# Patient Record
Sex: Female | Born: 1945 | Race: White | Hispanic: No | Marital: Married | Smoking: Never smoker
Health system: Southern US, Community
[De-identification: ages and names within clinical notes are randomized; demographics above are authoritative.]

## PROBLEM LIST (undated history)

## (undated) DIAGNOSIS — F419 Anxiety disorder, unspecified: Secondary | ICD-10-CM

## (undated) DIAGNOSIS — K219 Gastro-esophageal reflux disease without esophagitis: Secondary | ICD-10-CM

## (undated) DIAGNOSIS — F329 Major depressive disorder, single episode, unspecified: Secondary | ICD-10-CM

## (undated) DIAGNOSIS — F32A Depression, unspecified: Secondary | ICD-10-CM

## (undated) DIAGNOSIS — M1990 Unspecified osteoarthritis, unspecified site: Secondary | ICD-10-CM

## (undated) DIAGNOSIS — R002 Palpitations: Secondary | ICD-10-CM

## (undated) DIAGNOSIS — C801 Malignant (primary) neoplasm, unspecified: Secondary | ICD-10-CM

## (undated) HISTORY — PX: BREAST SURGERY: SHX581

## (undated) HISTORY — PX: CHOLECYSTECTOMY: SHX55

## (undated) HISTORY — PX: OSTOMY TAKE DOWN: SHX2142

## (undated) HISTORY — PX: APPENDECTOMY: SHX54

## (undated) HISTORY — PX: COLON SURGERY: SHX602

---

## 1998-08-11 ENCOUNTER — Other Ambulatory Visit: Admission: RE | Admit: 1998-08-11 | Discharge: 1998-08-11 | Payer: Self-pay | Admitting: Obstetrics and Gynecology

## 1999-12-27 ENCOUNTER — Other Ambulatory Visit: Admission: RE | Admit: 1999-12-27 | Discharge: 1999-12-27 | Payer: Self-pay | Admitting: Obstetrics and Gynecology

## 2001-06-19 ENCOUNTER — Other Ambulatory Visit: Admission: RE | Admit: 2001-06-19 | Discharge: 2001-06-19 | Payer: Self-pay | Admitting: Obstetrics and Gynecology

## 2002-07-30 ENCOUNTER — Other Ambulatory Visit: Admission: RE | Admit: 2002-07-30 | Discharge: 2002-07-30 | Payer: Self-pay | Admitting: Obstetrics and Gynecology

## 2003-08-20 ENCOUNTER — Other Ambulatory Visit: Admission: RE | Admit: 2003-08-20 | Discharge: 2003-08-20 | Payer: Self-pay | Admitting: Obstetrics and Gynecology

## 2004-04-04 ENCOUNTER — Encounter: Admission: RE | Admit: 2004-04-04 | Discharge: 2004-04-04 | Payer: Self-pay | Admitting: Obstetrics and Gynecology

## 2004-06-04 ENCOUNTER — Encounter: Payer: Self-pay | Admitting: Family Medicine

## 2004-06-04 LAB — CONVERTED CEMR LAB

## 2004-07-20 ENCOUNTER — Ambulatory Visit: Payer: Self-pay | Admitting: Family Medicine

## 2004-08-26 ENCOUNTER — Ambulatory Visit (HOSPITAL_COMMUNITY): Admission: RE | Admit: 2004-08-26 | Discharge: 2004-08-26 | Payer: Self-pay | Admitting: Surgery

## 2004-08-26 ENCOUNTER — Encounter (INDEPENDENT_AMBULATORY_CARE_PROVIDER_SITE_OTHER): Payer: Self-pay | Admitting: Specialist

## 2004-08-26 ENCOUNTER — Ambulatory Visit (HOSPITAL_BASED_OUTPATIENT_CLINIC_OR_DEPARTMENT_OTHER): Admission: RE | Admit: 2004-08-26 | Discharge: 2004-08-26 | Payer: Self-pay | Admitting: Surgery

## 2004-09-01 ENCOUNTER — Other Ambulatory Visit: Admission: RE | Admit: 2004-09-01 | Discharge: 2004-09-01 | Payer: Self-pay | Admitting: Obstetrics and Gynecology

## 2005-02-28 ENCOUNTER — Ambulatory Visit: Payer: Self-pay | Admitting: Family Medicine

## 2005-09-13 ENCOUNTER — Ambulatory Visit: Payer: Self-pay | Admitting: Family Medicine

## 2005-09-27 ENCOUNTER — Other Ambulatory Visit: Admission: RE | Admit: 2005-09-27 | Discharge: 2005-09-27 | Payer: Self-pay | Admitting: Obstetrics and Gynecology

## 2005-12-20 ENCOUNTER — Encounter: Admission: RE | Admit: 2005-12-20 | Discharge: 2005-12-20 | Payer: Self-pay | Admitting: Gastroenterology

## 2005-12-28 ENCOUNTER — Encounter (INDEPENDENT_AMBULATORY_CARE_PROVIDER_SITE_OTHER): Payer: Self-pay | Admitting: *Deleted

## 2005-12-28 ENCOUNTER — Ambulatory Visit: Payer: Self-pay | Admitting: Pulmonary Disease

## 2005-12-28 ENCOUNTER — Inpatient Hospital Stay (HOSPITAL_COMMUNITY): Admission: RE | Admit: 2005-12-28 | Discharge: 2006-01-29 | Payer: Self-pay | Admitting: Surgery

## 2006-01-08 ENCOUNTER — Encounter (INDEPENDENT_AMBULATORY_CARE_PROVIDER_SITE_OTHER): Payer: Self-pay | Admitting: *Deleted

## 2006-02-03 ENCOUNTER — Emergency Department (HOSPITAL_COMMUNITY): Admission: EM | Admit: 2006-02-03 | Discharge: 2006-02-04 | Payer: Self-pay | Admitting: Emergency Medicine

## 2006-02-04 ENCOUNTER — Ambulatory Visit (HOSPITAL_COMMUNITY): Admission: RE | Admit: 2006-02-04 | Discharge: 2006-02-04 | Payer: Self-pay | Admitting: Emergency Medicine

## 2006-02-04 ENCOUNTER — Encounter: Payer: Self-pay | Admitting: Vascular Surgery

## 2006-03-13 ENCOUNTER — Emergency Department (HOSPITAL_COMMUNITY): Admission: EM | Admit: 2006-03-13 | Discharge: 2006-03-13 | Payer: Self-pay | Admitting: Emergency Medicine

## 2006-03-13 ENCOUNTER — Encounter: Admission: RE | Admit: 2006-03-13 | Discharge: 2006-03-13 | Payer: Self-pay | Admitting: Surgery

## 2006-03-16 ENCOUNTER — Ambulatory Visit: Payer: Self-pay | Admitting: Family Medicine

## 2006-03-19 ENCOUNTER — Ambulatory Visit: Payer: Self-pay | Admitting: Family Medicine

## 2006-03-21 ENCOUNTER — Ambulatory Visit: Payer: Self-pay | Admitting: Family Medicine

## 2006-03-23 ENCOUNTER — Ambulatory Visit: Payer: Self-pay | Admitting: Family Medicine

## 2006-03-28 ENCOUNTER — Ambulatory Visit: Payer: Self-pay | Admitting: Oncology

## 2006-03-30 ENCOUNTER — Ambulatory Visit: Payer: Self-pay | Admitting: Family Medicine

## 2006-04-03 ENCOUNTER — Ambulatory Visit: Payer: Self-pay | Admitting: Family Medicine

## 2006-04-06 ENCOUNTER — Ambulatory Visit: Payer: Self-pay | Admitting: Family Medicine

## 2006-04-11 ENCOUNTER — Ambulatory Visit: Payer: Self-pay | Admitting: Oncology

## 2006-04-13 ENCOUNTER — Ambulatory Visit: Payer: Self-pay | Admitting: Family Medicine

## 2006-04-20 ENCOUNTER — Ambulatory Visit: Payer: Self-pay | Admitting: Family Medicine

## 2006-05-04 ENCOUNTER — Ambulatory Visit: Payer: Self-pay | Admitting: Family Medicine

## 2006-05-05 ENCOUNTER — Ambulatory Visit: Payer: Self-pay | Admitting: Oncology

## 2006-05-15 ENCOUNTER — Ambulatory Visit (HOSPITAL_COMMUNITY): Admission: RE | Admit: 2006-05-15 | Discharge: 2006-05-15 | Payer: Self-pay | Admitting: Surgery

## 2006-06-04 ENCOUNTER — Ambulatory Visit: Payer: Self-pay | Admitting: Oncology

## 2006-07-04 ENCOUNTER — Emergency Department: Payer: Self-pay | Admitting: Emergency Medicine

## 2006-07-05 ENCOUNTER — Ambulatory Visit: Payer: Self-pay | Admitting: Oncology

## 2006-08-04 ENCOUNTER — Ambulatory Visit: Payer: Self-pay | Admitting: Oncology

## 2006-09-04 ENCOUNTER — Ambulatory Visit: Payer: Self-pay | Admitting: Oncology

## 2006-10-05 ENCOUNTER — Ambulatory Visit: Payer: Self-pay | Admitting: Oncology

## 2006-11-03 ENCOUNTER — Ambulatory Visit: Payer: Self-pay | Admitting: Oncology

## 2006-11-27 ENCOUNTER — Ambulatory Visit: Payer: Self-pay | Admitting: Internal Medicine

## 2006-12-04 ENCOUNTER — Ambulatory Visit: Payer: Self-pay | Admitting: Internal Medicine

## 2006-12-04 ENCOUNTER — Ambulatory Visit: Payer: Self-pay | Admitting: Oncology

## 2006-12-28 ENCOUNTER — Encounter: Admission: RE | Admit: 2006-12-28 | Discharge: 2006-12-28 | Payer: Self-pay | Admitting: Surgery

## 2007-01-03 ENCOUNTER — Ambulatory Visit: Payer: Self-pay | Admitting: Oncology

## 2007-01-03 ENCOUNTER — Ambulatory Visit: Payer: Self-pay | Admitting: Internal Medicine

## 2007-01-21 DIAGNOSIS — L259 Unspecified contact dermatitis, unspecified cause: Secondary | ICD-10-CM | POA: Insufficient documentation

## 2007-01-23 ENCOUNTER — Ambulatory Visit: Payer: Self-pay | Admitting: Internal Medicine

## 2007-02-03 ENCOUNTER — Ambulatory Visit: Payer: Self-pay | Admitting: Internal Medicine

## 2007-02-03 ENCOUNTER — Ambulatory Visit: Payer: Self-pay | Admitting: Oncology

## 2007-03-05 ENCOUNTER — Ambulatory Visit: Payer: Self-pay | Admitting: Internal Medicine

## 2007-03-05 ENCOUNTER — Ambulatory Visit: Payer: Self-pay | Admitting: Oncology

## 2007-03-12 ENCOUNTER — Ambulatory Visit: Payer: Self-pay | Admitting: *Deleted

## 2007-03-12 ENCOUNTER — Inpatient Hospital Stay (HOSPITAL_COMMUNITY): Admission: AD | Admit: 2007-03-12 | Discharge: 2007-03-19 | Payer: Self-pay | Admitting: Gastroenterology

## 2007-03-13 ENCOUNTER — Encounter (INDEPENDENT_AMBULATORY_CARE_PROVIDER_SITE_OTHER): Payer: Self-pay | Admitting: Gastroenterology

## 2007-04-03 ENCOUNTER — Ambulatory Visit: Payer: Self-pay | Admitting: Family Medicine

## 2007-04-03 DIAGNOSIS — Z85038 Personal history of other malignant neoplasm of large intestine: Secondary | ICD-10-CM | POA: Insufficient documentation

## 2007-04-03 DIAGNOSIS — S90129A Contusion of unspecified lesser toe(s) without damage to nail, initial encounter: Secondary | ICD-10-CM | POA: Insufficient documentation

## 2007-04-03 DIAGNOSIS — M1990 Unspecified osteoarthritis, unspecified site: Secondary | ICD-10-CM | POA: Insufficient documentation

## 2007-04-03 DIAGNOSIS — Z86718 Personal history of other venous thrombosis and embolism: Secondary | ICD-10-CM | POA: Insufficient documentation

## 2007-04-03 DIAGNOSIS — E7800 Pure hypercholesterolemia, unspecified: Secondary | ICD-10-CM | POA: Insufficient documentation

## 2007-04-03 DIAGNOSIS — Z8619 Personal history of other infectious and parasitic diseases: Secondary | ICD-10-CM | POA: Insufficient documentation

## 2007-04-03 DIAGNOSIS — M8580 Other specified disorders of bone density and structure, unspecified site: Secondary | ICD-10-CM | POA: Insufficient documentation

## 2007-04-04 ENCOUNTER — Telehealth: Payer: Self-pay | Admitting: Family Medicine

## 2007-04-05 ENCOUNTER — Ambulatory Visit: Payer: Self-pay | Admitting: Oncology

## 2007-05-06 ENCOUNTER — Ambulatory Visit: Payer: Self-pay | Admitting: Oncology

## 2007-05-10 ENCOUNTER — Encounter: Payer: Self-pay | Admitting: Family Medicine

## 2007-05-21 ENCOUNTER — Encounter (INDEPENDENT_AMBULATORY_CARE_PROVIDER_SITE_OTHER): Payer: Self-pay | Admitting: Surgery

## 2007-05-21 ENCOUNTER — Inpatient Hospital Stay (HOSPITAL_COMMUNITY): Admission: RE | Admit: 2007-05-21 | Discharge: 2007-05-28 | Payer: Self-pay | Admitting: Surgery

## 2007-06-05 ENCOUNTER — Ambulatory Visit: Payer: Self-pay | Admitting: Oncology

## 2007-06-14 ENCOUNTER — Encounter: Payer: Self-pay | Admitting: Family Medicine

## 2007-07-06 ENCOUNTER — Ambulatory Visit: Payer: Self-pay | Admitting: Oncology

## 2007-08-05 ENCOUNTER — Ambulatory Visit: Payer: Self-pay | Admitting: Oncology

## 2007-08-15 ENCOUNTER — Encounter: Payer: Self-pay | Admitting: Family Medicine

## 2007-09-05 ENCOUNTER — Ambulatory Visit: Payer: Self-pay | Admitting: Oncology

## 2007-11-07 ENCOUNTER — Encounter: Payer: Self-pay | Admitting: Family Medicine

## 2007-11-07 IMAGING — CT CT PELVIS W/ CM
1 of 3 series · 14 of 32 positions shown, 19 images · IV contrast (OMNIPAQUE [ID])
Comparison: none

CLINICAL DATA: Follow up colonic mass seen on colonoscopy.
 ABDOMEN CT WITH CONTRAST:
TECHNIQUE: Multidetector CT imaging of the abdomen was performed following the standard protocol during bolus administration of intravenous contrast.
 Contrast:  100 cc of Omnipaque 300.
 Scans of the lung bases were normal.  There is a small (8 mm) hypodense lesion within the anterior left lobe of the liver (image #7).  This measures 70 Hounsfield units and is in indeterminate lesion.  There is a second 4 mm in size tiny indeterminate lesion within the dome of the right lobe of the liver noted on image #2.  Follow up studies over time may be helpful.  Alternatively, hepatic MRI may be helpful, if felt to be indicated.  The spleen, pancreas, kidneys, and adrenal glands have a normal appearance.  There is no retroperitoneal or mesenteric adenopathy.
TECHNIQUE: Multidetector CT imaging of the pelvis was performed following the standard protocol during bolus administration of intravenous contrast.
 There is no pelvic mass or adenopathy, and there is no free pelvic fluid.  A definite colonic mass is not demonstrated on this standard CT of the abdomen.

[Series 2: — · axial · 0.70mm/px · z∈[-362,-38]mm · 14 of 73 slices shown, 19 images]
[im 4/73  soft-tissue]
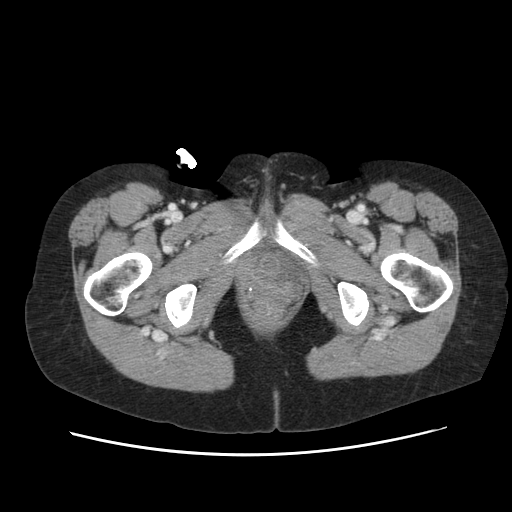
[im 4/73  bone]
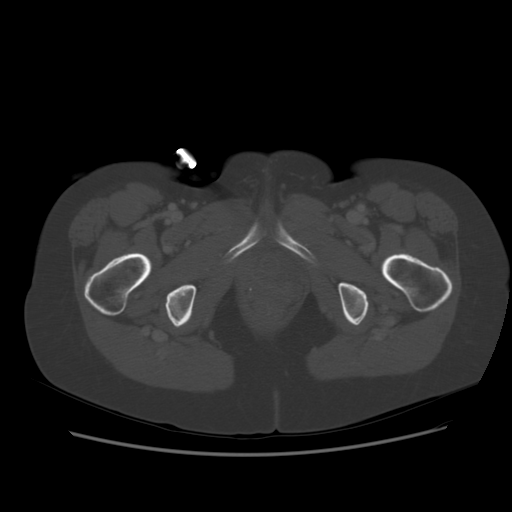
[im 12/73  soft-tissue]
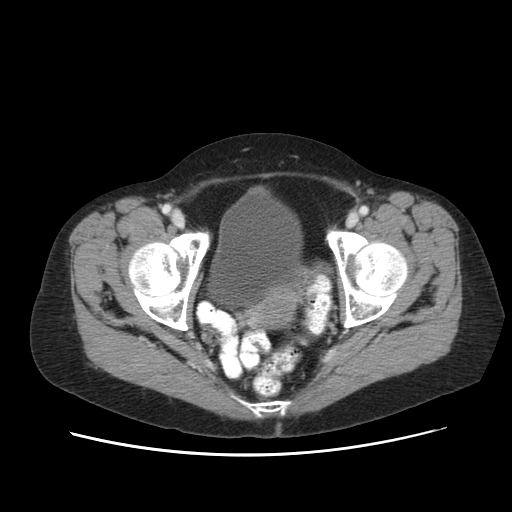
[im 16/73  soft-tissue]
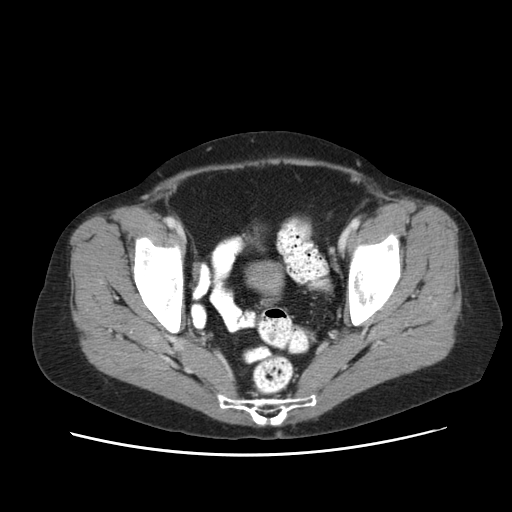
[im 19/73  soft-tissue]
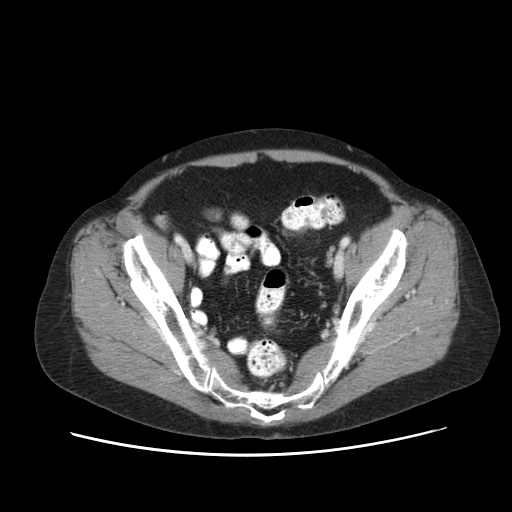
[im 27/73  soft-tissue]
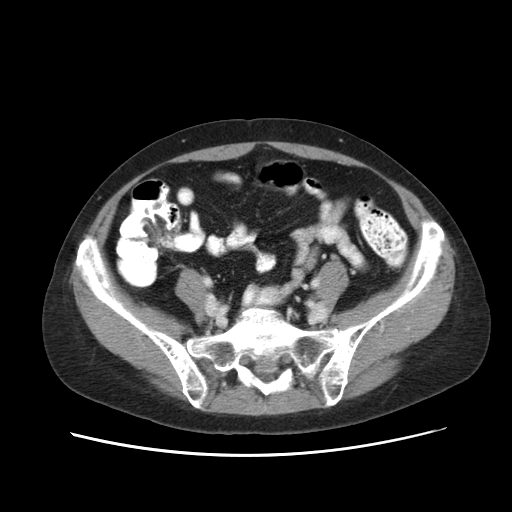
[im 31/73  soft-tissue]
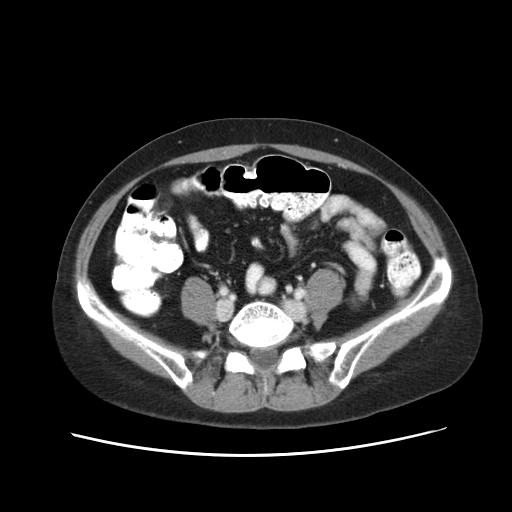
[im 38/73  soft-tissue]
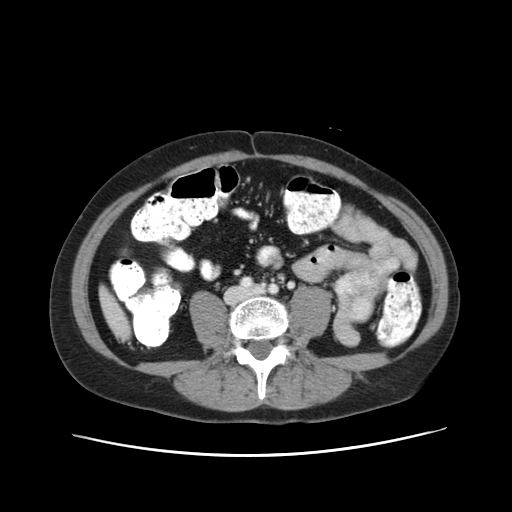
[im 42/73  soft-tissue]
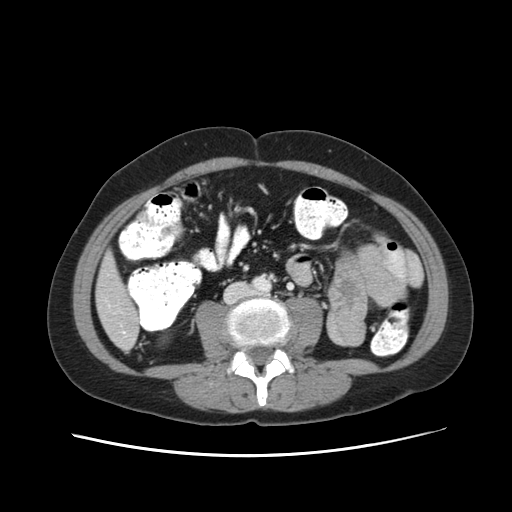
[im 46/73  soft-tissue]
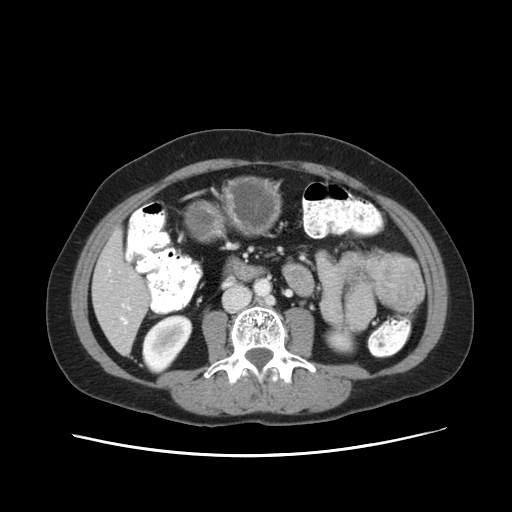
[im 46/73  bone]
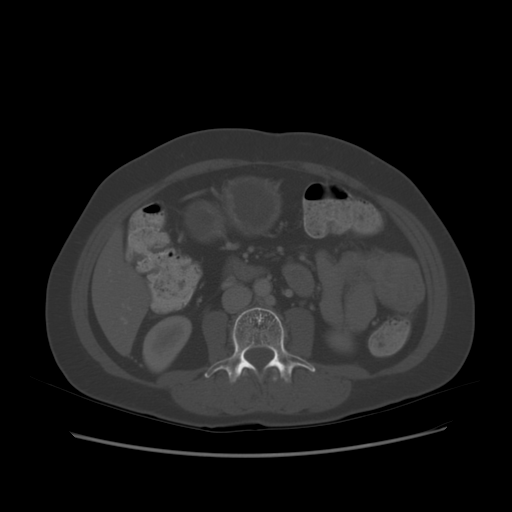
[im 54/73  soft-tissue]
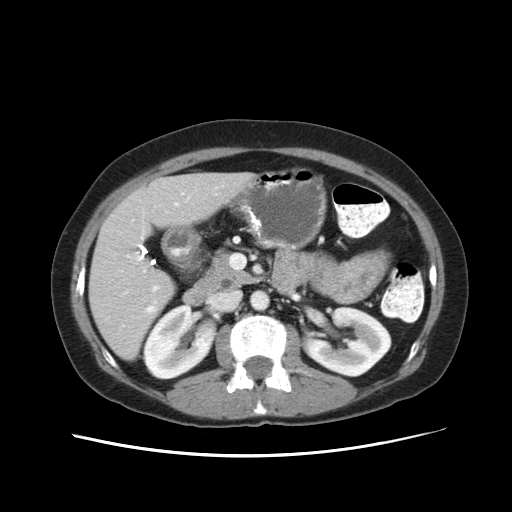
[im 57/73  soft-tissue]
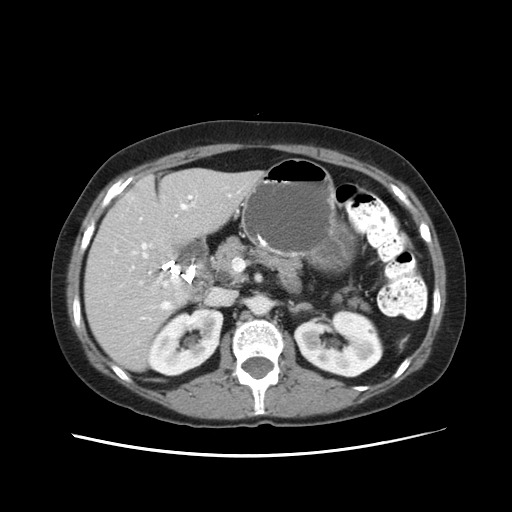
[im 57/73  lung]
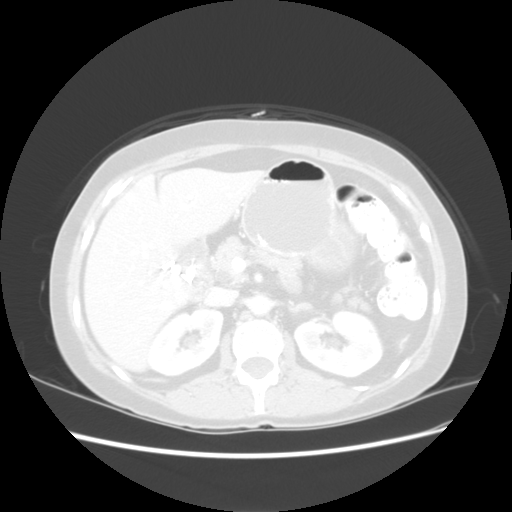
[im 61/73  soft-tissue]
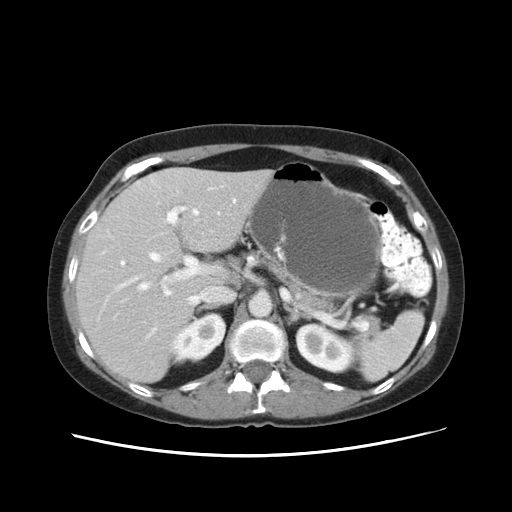
[im 61/73  lung]
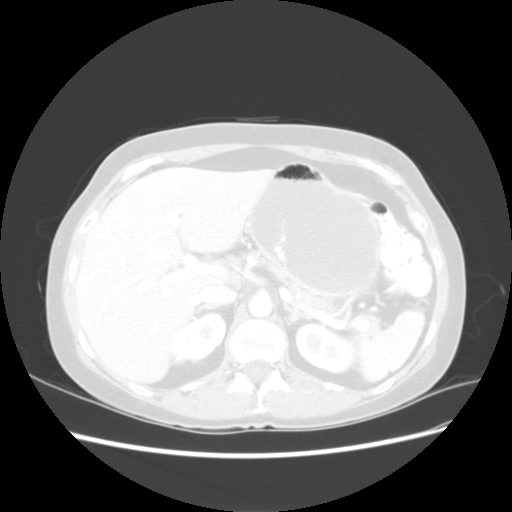
[im 65/73  lung]
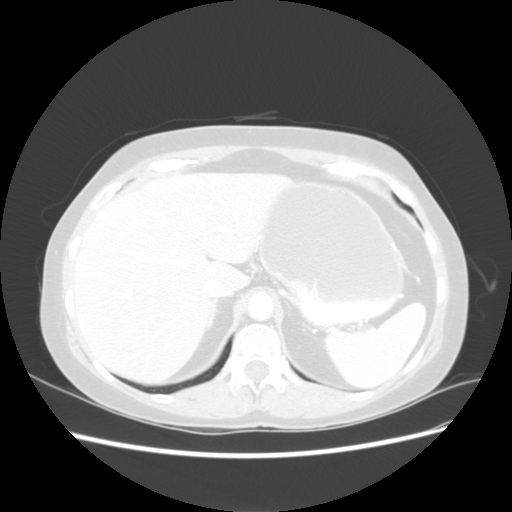
[im 69/73  soft-tissue]
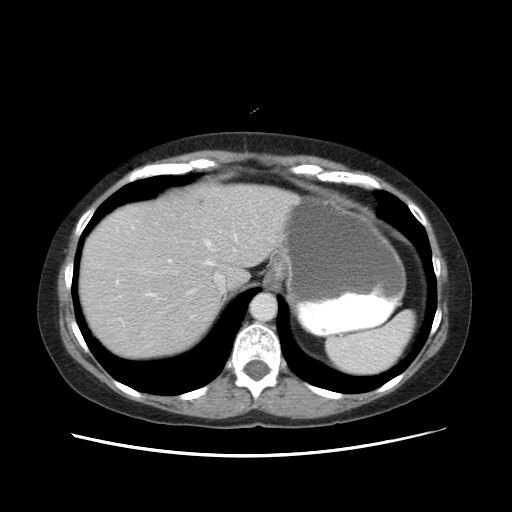
[im 69/73  lung]
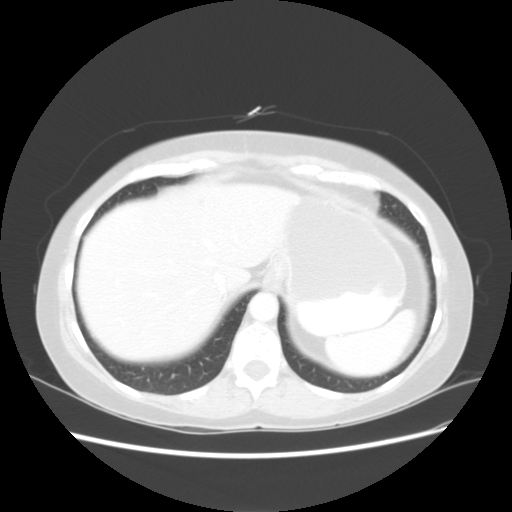

[14 of 32 positions shown; findings below may reference images not displayed]

IMPRESSION: 8 mm in size hypodense lesion within the anterior left lobe of the liver and tiny (4 mm) hypodense lesion within the dome of the right lobe of liver.  Follow up CT over time may be helpful.  Alternatively, hepatic MRI may be helpful in further evaluation, if felt to be indicated.  Otherwise negative CT of the abdomen.
 PELVIS CT WITH CONTRAST:
IMPRESSION: Negative CT of the pelvis.

## 2007-11-12 DIAGNOSIS — H9209 Otalgia, unspecified ear: Secondary | ICD-10-CM | POA: Insufficient documentation

## 2007-11-13 IMAGING — CR DG CHEST 2V
2 series · 2 of 2 positions shown · non-contrast
Comparison: none

CLINICAL DATA: Sigmoid colonic carcinoma.  Preoperative chest.
 CHEST - 2 VIEW ? 12/26/05:
 The lungs are clear and the heart and mediastinal structures are normal. There has been a previous cholecystectomy.

[w chest pa]
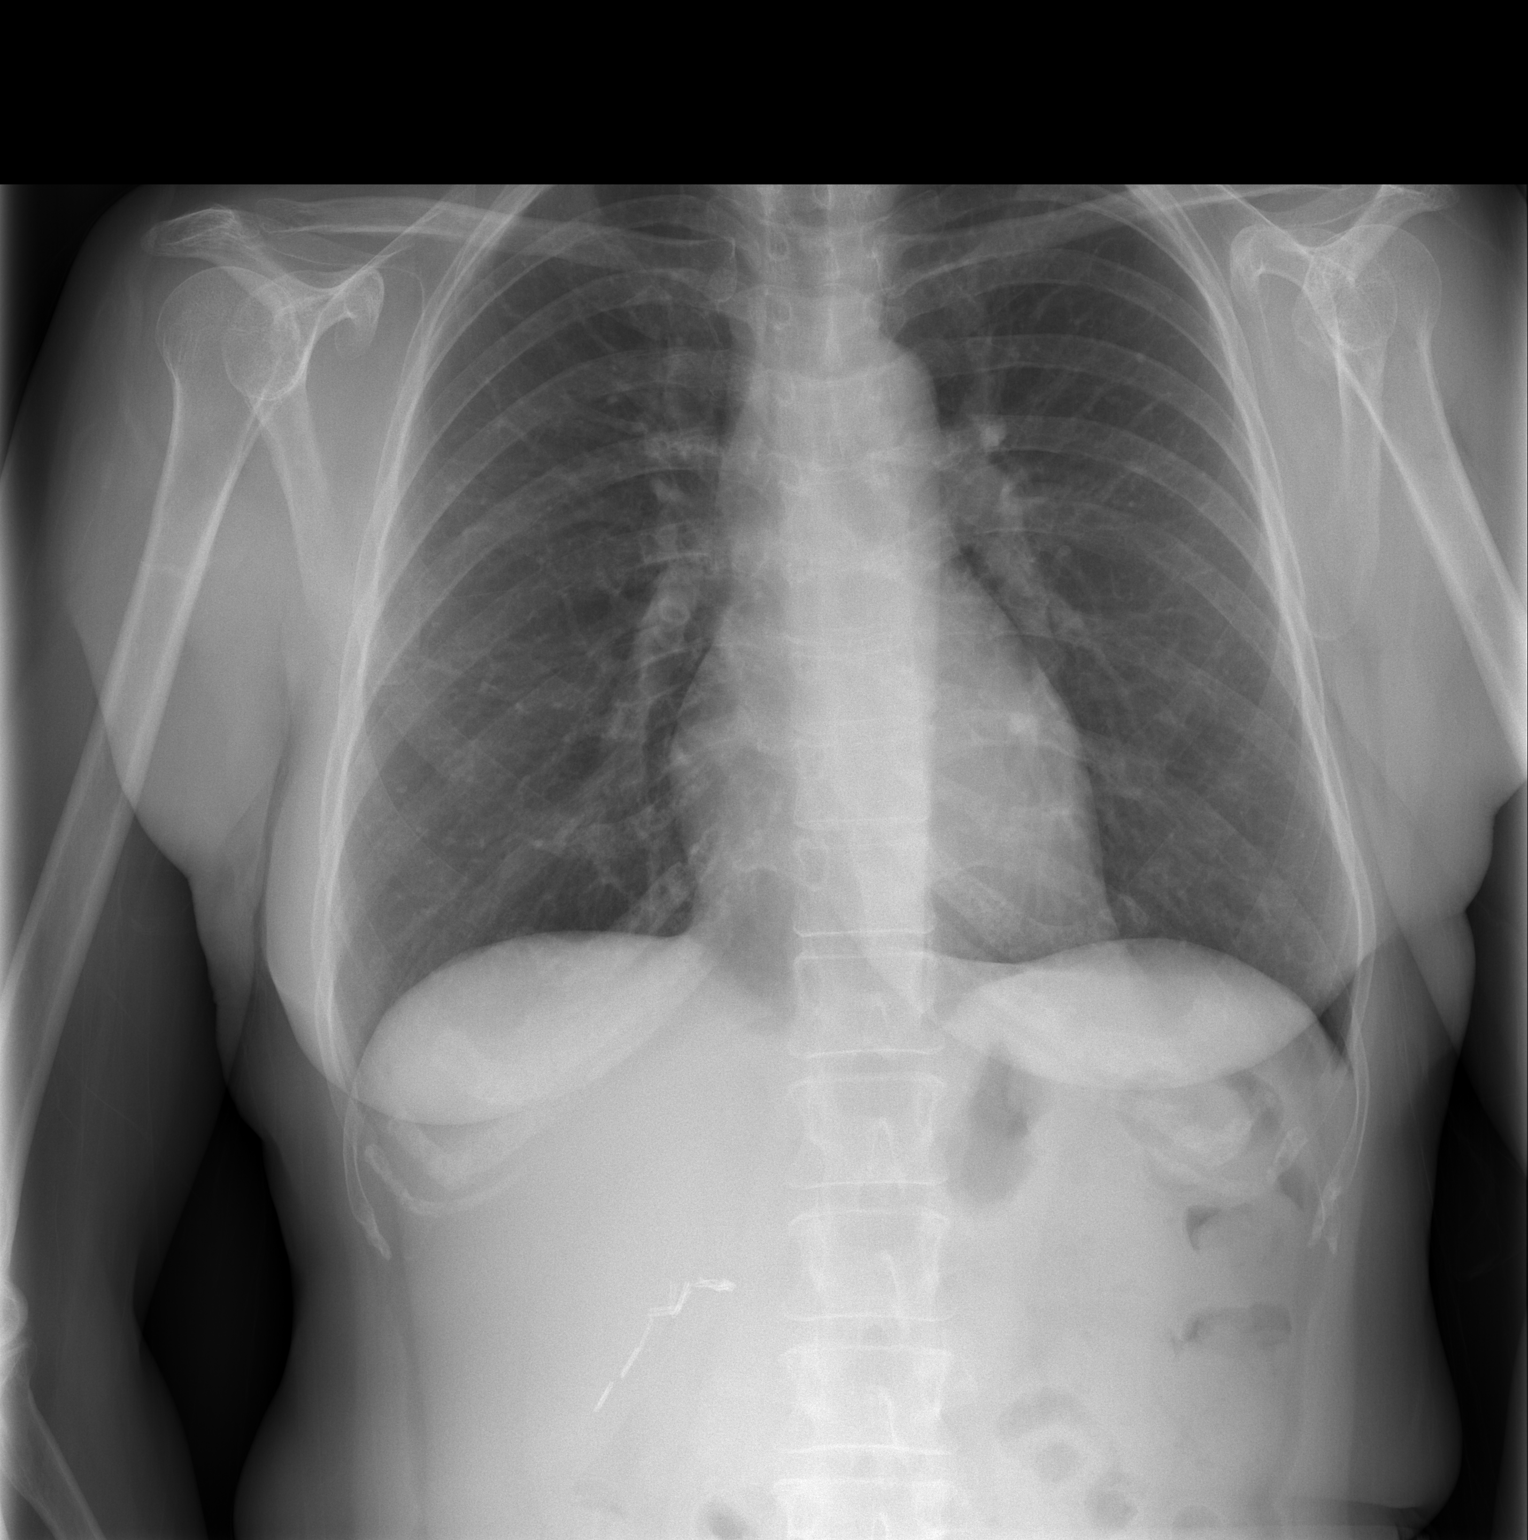

[w chest lat]
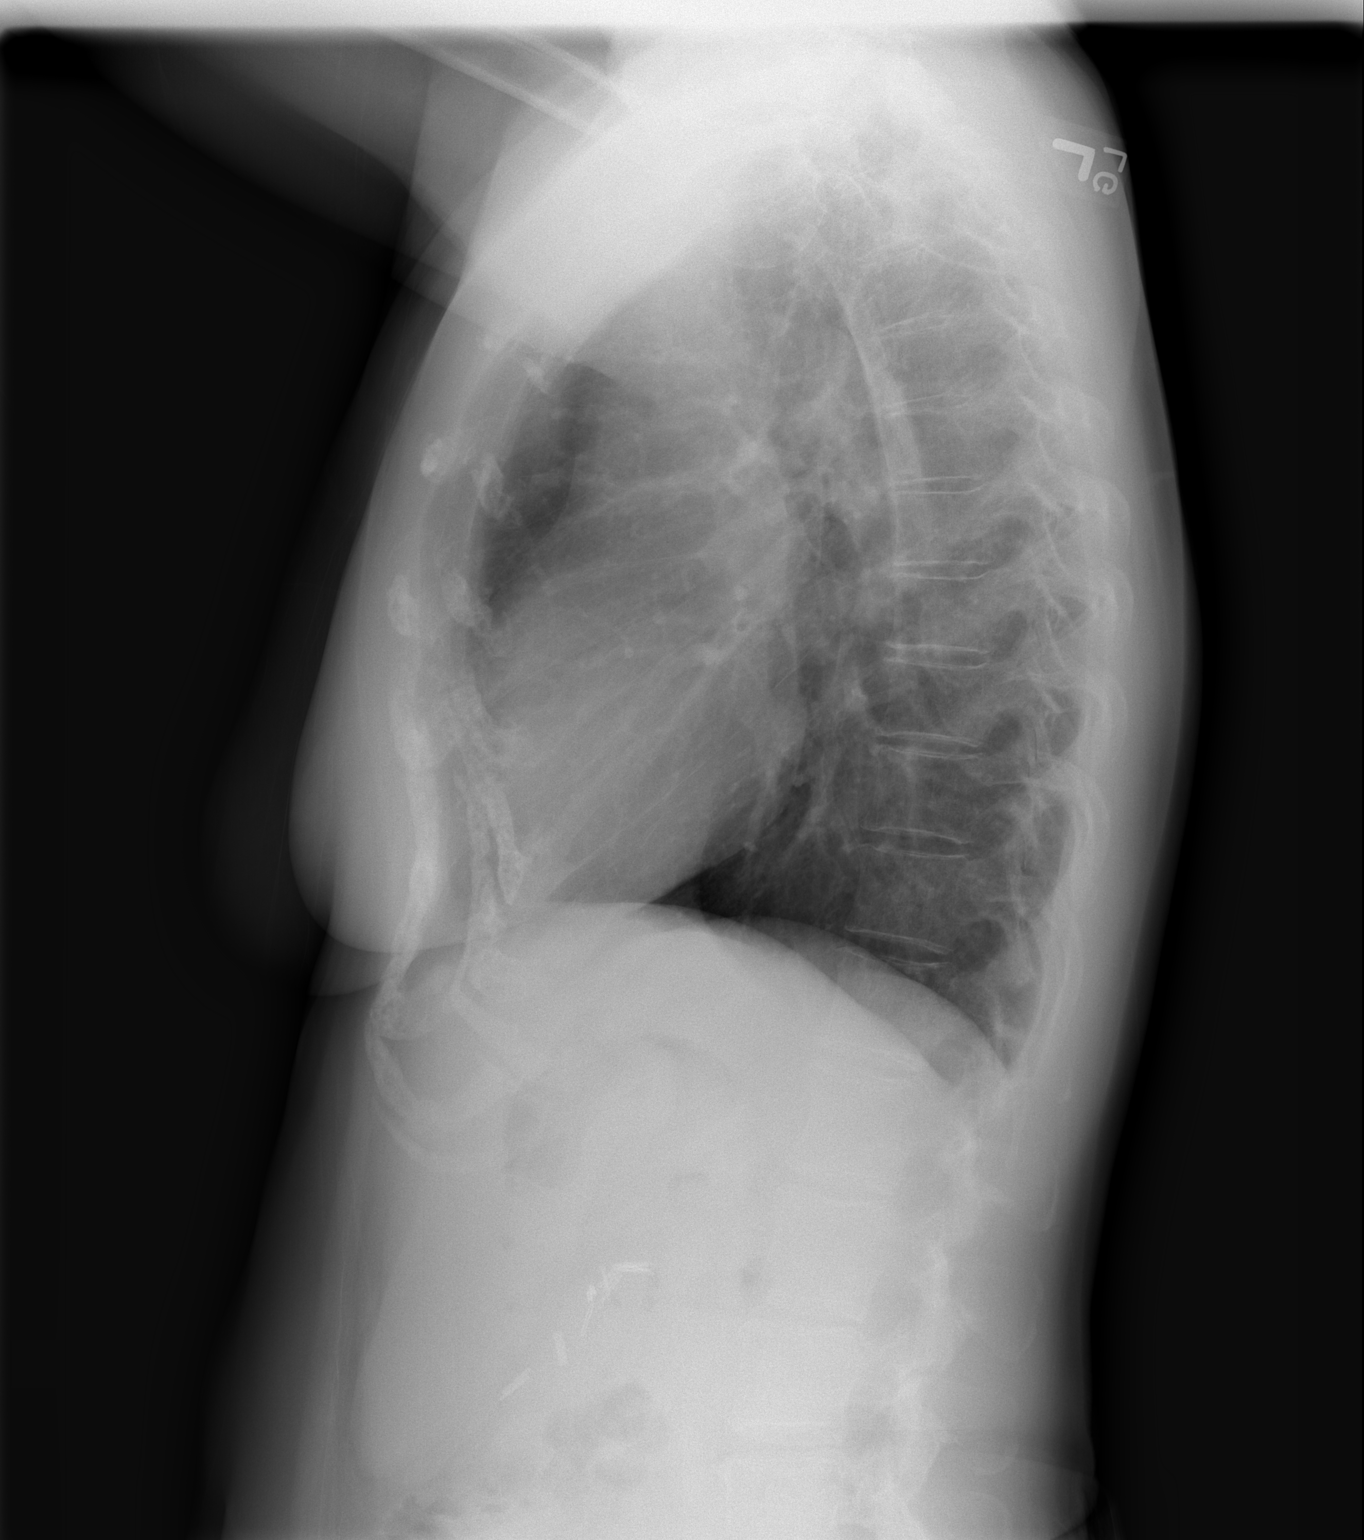

[2 of 2 positions shown; findings below may reference images not displayed]

IMPRESSION: No evidence for active chest disease.

## 2007-11-14 ENCOUNTER — Ambulatory Visit: Payer: Self-pay | Admitting: Internal Medicine

## 2007-12-05 ENCOUNTER — Ambulatory Visit: Payer: Self-pay | Admitting: Oncology

## 2008-01-03 ENCOUNTER — Ambulatory Visit: Payer: Self-pay | Admitting: Oncology

## 2008-03-04 ENCOUNTER — Ambulatory Visit: Payer: Self-pay | Admitting: Oncology

## 2008-03-19 ENCOUNTER — Ambulatory Visit: Payer: Self-pay | Admitting: Oncology

## 2008-04-04 ENCOUNTER — Ambulatory Visit: Payer: Self-pay | Admitting: Oncology

## 2008-04-17 ENCOUNTER — Ambulatory Visit: Payer: Self-pay | Admitting: Family Medicine

## 2008-04-17 DIAGNOSIS — K219 Gastro-esophageal reflux disease without esophagitis: Secondary | ICD-10-CM | POA: Insufficient documentation

## 2008-04-20 LAB — CONVERTED CEMR LAB
ALT: 18 units/L (ref 0–35)
AST: 23 units/L (ref 0–37)
Cholesterol: 278 mg/dL (ref 0–200)
Direct LDL: 144.9 mg/dL
HDL: 57 mg/dL (ref >39.0)
Total CHOL/HDL Ratio: 4.9
Triglycerides: 232 mg/dL (ref 0–149)
VLDL: 46 mg/dL — ABNORMAL HIGH (ref 0–40)

## 2008-06-02 ENCOUNTER — Ambulatory Visit: Payer: Self-pay | Admitting: Family Medicine

## 2008-07-05 ENCOUNTER — Ambulatory Visit: Payer: Self-pay | Admitting: Oncology

## 2008-07-15 ENCOUNTER — Ambulatory Visit: Payer: Self-pay | Admitting: Oncology

## 2008-08-04 ENCOUNTER — Ambulatory Visit: Payer: Self-pay | Admitting: Oncology

## 2008-08-18 ENCOUNTER — Telehealth: Payer: Self-pay | Admitting: Family Medicine

## 2008-08-20 ENCOUNTER — Ambulatory Visit: Payer: Self-pay | Admitting: Family Medicine

## 2008-08-20 DIAGNOSIS — J069 Acute upper respiratory infection, unspecified: Secondary | ICD-10-CM | POA: Insufficient documentation

## 2008-11-27 ENCOUNTER — Encounter: Payer: Self-pay | Admitting: Family Medicine

## 2008-12-03 ENCOUNTER — Ambulatory Visit: Payer: Self-pay | Admitting: Oncology

## 2008-12-07 ENCOUNTER — Ambulatory Visit: Payer: Self-pay | Admitting: Oncology

## 2009-01-02 ENCOUNTER — Ambulatory Visit: Payer: Self-pay | Admitting: Oncology

## 2009-01-11 ENCOUNTER — Encounter: Payer: Self-pay | Admitting: Family Medicine

## 2009-04-04 ENCOUNTER — Ambulatory Visit: Payer: Self-pay | Admitting: Oncology

## 2009-04-05 ENCOUNTER — Encounter: Payer: Self-pay | Admitting: Family Medicine

## 2009-04-05 ENCOUNTER — Ambulatory Visit: Payer: Self-pay | Admitting: Oncology

## 2009-05-05 ENCOUNTER — Ambulatory Visit: Payer: Self-pay | Admitting: Oncology

## 2009-06-24 ENCOUNTER — Ambulatory Visit: Payer: Self-pay | Admitting: Family Medicine

## 2009-08-04 ENCOUNTER — Ambulatory Visit: Payer: Self-pay | Admitting: Oncology

## 2009-09-04 ENCOUNTER — Ambulatory Visit: Payer: Self-pay | Admitting: Oncology

## 2009-12-03 ENCOUNTER — Ambulatory Visit: Payer: Self-pay | Admitting: Oncology

## 2009-12-23 ENCOUNTER — Encounter: Payer: Self-pay | Admitting: Family Medicine

## 2009-12-31 ENCOUNTER — Ambulatory Visit: Payer: Self-pay | Admitting: Oncology

## 2010-01-02 ENCOUNTER — Ambulatory Visit: Payer: Self-pay | Admitting: Oncology

## 2010-07-04 ENCOUNTER — Ambulatory Visit: Payer: Self-pay | Admitting: Oncology

## 2010-07-05 ENCOUNTER — Ambulatory Visit: Payer: Self-pay | Admitting: Oncology

## 2011-01-02 ENCOUNTER — Ambulatory Visit: Payer: Self-pay | Admitting: Oncology

## 2011-01-03 ENCOUNTER — Ambulatory Visit: Payer: Self-pay | Admitting: Oncology

## 2011-01-03 LAB — CEA: CEA: 1.2 ng/mL (ref 0.0–4.7)

## 2011-01-17 NOTE — Discharge Summary (Signed)
NAME:  Lori Peters, Lori Peters                 ACCOUNT NO.:  [id number]   MEDICAL RECORD NO.:  [id number]          PATIENT TYPE:  INP   LOCATION:  1423                         FACILITY:  [facility]   PHYSICIAN:  Shirley Friar, MDDATE OF BIRTH:  15-Aug-1946   DATE OF ADMISSION:  03/12/2007  DATE OF DISCHARGE:  03/19/2007                               DISCHARGE SUMMARY   DISCHARGE DIAGNOSES:  1. Colon polyps (adenomatous polyp x1, hyperplastic polyp x3.)  2. History of sigmoid colon cancer.  3. History of anastomotic leak requiring a Hartman's procedure.  4. History of fascial dehiscence.  5. 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  6. Status post appendectomy.   HOSPITAL COURSE:  [name] is a 65 year old white female who was  electively admitted for removal of polyps that were seen on a recent  colonoscopy but were not removed, since the patient was on Coumadin. She  is being evaluated to undergo reanastomosis of her colon but requested  that these polyps be removed prior to that planned procedure. Due to her  need for chronic Coumadin secondary to history of deep vein thrombosis,  decision was made to electively admit her and place her on heparin and  take her off Coumadin. The day after admission her INR was in the  adequate range to proceed with colonoscopy and polypectomy. She  underwent colonoscopy on March 13, 2007 and had 4 flat polyps removed with  the largest being 1 cm in size. The flat polyp seen in the cecum was  found to be an adenomatous polyp. Although the entire polyps was not  able to be removed despite saline injection from mucosectomy, the  remaining portion was fulgurated with Argon plasma coagulation. The  patient did fine post-procedure without any significant bleeding into  her ostomy. She had a small amount of oozing of blood the day following  her procedure into her ostomy but did find after that short episode. She  was placed back on Coumadin and her diet  was advanced. Prior to her  discharge, she had lower extremity Doppler's done, which were negative  for DVT. Although her deep vein thrombosis was in 2007, a decision was  made to leave her on Coumadin and let her followup with her oncologist,  Dr. Doylene Canning to have her oncologist make a final decision on whether to  discontinue the Coumadin. At day of discharge, reportedly, they  recommended that she stay on Coumadin until preparation for her upcoming  reanastomosis surgery. She was discharged home on March 19, 2007 in  improved condition. She was discharged home on a regular diet with her  home Coumadin dose of 7.5 mg daily and continuation of her other  medicines. In terms of surveillance colonoscopy, her colon cancer was  diagnosed in April of 2007 and the colonoscopies that she has had this  year x2 were for her 1 year followup. She should have a repeat  surveillance colonoscopy in the spring of 2010.      Shirley Friar, MD  Electronically Signed     VCS/MEDQ  D:  04/20/2007  T:  04/21/2007  Job:  [id number]   cc:   Thomas A. Cornett, M.D.  [address] [place] [address]  [place] [address]   Johney Maine  Fax: [phone]

## 2011-01-17 NOTE — Discharge Summary (Signed)
NAME:  Lori Peters, Lori Peters NO.:  [id number]   MEDICAL RECORD NO.:  [id number]          PATIENT TYPE:  INP   LOCATION:  1526                         FACILITY:  [facility]   PHYSICIAN:  Clovis Pu. Cornett, M.D.DATE OF BIRTH:  01-06-1946   DATE OF ADMISSION:  05/21/2007  DATE OF DISCHARGE:  05/28/2007                               DISCHARGE SUMMARY   ADMITTING DIAGNOSES:  1. History of colon cancer, status post sigmoid colectomy with      subsequent leak and Hartmann procedure.  2. Ventral hernia.   DISCHARGE DIAGNOSES:  1. History of colon cancer, status post sigmoid colectomy with      subsequent leak and Hartmann procedure.  2. Ventral hernia.   PROCEDURE PERFORMED:  1. Closure of colostomy.  2. Repair of complex ventral hernia.   BRIEF HISTORY:  The patient is a 65 year old female who last May  underwent sigmoid colectomy for a T3, N1, sigmoid colon cancer.  Unfortunately, she leaked on postoperative day 4 requiring emergent  Hartmann procedure.  She subsequently dehisced 2 days later and required  reconstruction of her abdominal wall.  She recovered from all of this  and went through chemotherapy.  She has completed all of her treatments  and returned to have her colostomy closed and to have her incisional  hernia repaired.   HOSPITAL COURSE:  The patient was admitted on May 21, 2007, after  closure of colostomy and repair of complex ventral hernia.  She had 2  drains in place.  She was in the step-down unit for 2 days and did well.  She was then transferred to the floor and remained afebrile.  She did  have a drop in her hemoglobin, but this stabilized and her  anticoagulation was restarted with Lovenox and then subsequently  switched to Coumadin when she was on p.o. intake.  Her bowel function  returned by postoperative day 4.  Diet was advanced over the next 2-1/2  days.  JP drainage from her pelvis at her anastomosis remained  serosanguineous, and she  began to have bowel movement without any  difficulty.  Her JP under her flaps drained serosanguineous fluids; and,  again, this was down well below 50 mL at discharge.  Her hemoglobin  remained stable.  White count was normal.  INR was 1.4 at the time of  discharge on 7.5 mg of Coumadin.  She was ambulating in a binder.  Her  staples were removed on postoperative day 7, as well as her drains since  the drainage was minimal.  At this point in time, she was well on her  soft diet; and was discharged to home at this point.   DISCHARGE INSTRUCTIONS:  I will have her come back and see me in 7 to 10  days.  She will need to contact her medical oncologist to adjust her  Coumadin dose as needed, and I have recommended that she get an INR  drawn Friday.  She will go home on Coumadin 7.5 mg daily which was her  home dose.  We will give her script for  Percocet, as well, for pain.  She will refrain from driving and heavy  lifting until I tell her otherwise, over the next 4 to 6 weeks to wear  an abdominal binder, and may shower at this point.  Her diet will be  soft mechanical.   CONDITION ON DISCHARGE:  Improved.      Lori Peters A. Cornett, M.D.  Electronically Signed     TAC/MEDQ  D:  05/28/2007  T:  05/28/2007  Job:  [id number]   cc:   Dr. Denny Peon

## 2011-01-17 NOTE — Op Note (Signed)
NAME:  Lori Peters, Lori Peters NO.:  [id number]   MEDICAL RECORD NO.:  [id number]          PATIENT TYPE:  INP   LOCATION:  1423                         FACILITY:  [facility]   PHYSICIAN:  Shirley Friar, MDDATE OF BIRTH:  09-13-1945   DATE OF PROCEDURE:  03/13/2007  DATE OF DISCHARGE:                               OPERATIVE REPORT   INDICATION:  Colon polyps seen during recent colonoscopy that needed  removal that were not removed during previous time as the patient was on  Coumadin.   MEDICATIONS:  Fentanyl 100 mcg IV, Versed 8 mg IV.   FINDINGS:  A pediatric Pentax colonoscope was inserted to the descending  colonostomy and advanced through a adequately prepped colon to the  cecum.  The appendiceal orifice and ileocecal valve were identified.  The terminal ileum was intubated and was normal in appearance.  In the  cecum was a 3-mm sessile polyp that was removed with cold snare.  A 1 cm  flat polyp was also noted in the cecum and 5 mL of normal saline was  injected into the polyp to lift it off the wall.  After saline injection  it was partially removed with piecemeal hot snare.  The remaining  portion could not be removed due to its flat nature and therefore argon  plasma coagulation was used to eradicate the remaining portion of the  polyp.  No immediate complications were identified.  Further withdrawal  of the colonoscope, a 5 mm flat polyp was hot biopsied and removed.  Also a 8 mm to 1 cm flat polyp was removed with hot snare and also hot  biopsy due to semi-sessile appearance.  No immediate complications were  identified.   ASSESSMENT:  1. Colon polyps x4 with removal as stated above.   PLAN:  1. Follow-up on pathology.  2. Resume heparin  3. Hold off on Coumadin dosing until 03/14/2007.  4. Follow hemoglobins and hematocrits.  5. Will defer to Dr. Luisa Hart timing of desired ostomy takedown and      reanastomosis.Shirley Friar, MD  Electronically Signed     VCS/MEDQ  D:  03/13/2007  T:  03/14/2007  Job:  [id number]   cc:   Thomas A. Cornett, M.D.  [address] [place] [address]  [place] [address]   Duke Salvia. Marcelle Overlie, M.D.  Fax: [phone]   Johney Maine  Fax: [phone]

## 2011-01-17 NOTE — Op Note (Signed)
NAME:  Lori Peters, Lori Peters NO.:  [id number]   MEDICAL RECORD NO.:  [id number]          PATIENT TYPE:  INP   LOCATION:  1230                         [facility]ACILITY:  [facility]   PHYSICIAN:  Clovis Pu. Cornett, M.D.DATE OF BIRTH:  13-Sep-1945   DATE OF PROCEDURE:  05/21/2007  DATE OF DISCHARGE:                               OPERATIVE REPORT   PREOPERATIVE DIAGNOSES:  1. Large ventral hernia.  2. 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  3. Port-A-Cath.   POSTOPERATIVE DIAGNOSES:  1. Large ventral hernia.  2. 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  3. Port-A-Cath.  4. Extensive intraabdominal adhesions.   PROCEDURES:  1. Repair of ventral hernia with Ethicon biologic mesh measuring 16 x      20 cm.  2. Formation of myocutaneous abdominal flaps, with lateral release of      the external oblique.  3. Colostomy closure.  4. Removal of port-A-Cath.  5. Extensive lysis of adhesions, taking 2 hours.  6. Rigid sigmoidoscopy.   SURGEON:  Maisie Fus A. Cornett, M.D.   ASSISTANT:  Leonie Man, M.D.   ANESTHESIA:  General endotracheal anesthesia   ESTIMATED BLOOD LOSS:  200 cc.   DRAIN:  Two #19 Blake drains, one to pelvis, second over top of the  abdominal wall under the skin.   SPECIMENS:  None.   INDICATIONS FOR PROCEDURE:  The patient is a 65 year old female who  underwent over a year ago a sigmoid colectomy for colon cancer.  Unfortunately, she had an anastomotic leak requiring formation of a  colostomy.  She then developed abdominal wall dehiscence and had to  return to the operating room and had the defect bridged with cadaveric  skin.  She recovered from all this and completed chemotherapy.  She  returns today to have her colostomy reversed.  Unfortunately, she  developed a very large ventral hernia that would need to be addressed at  the same time.  She also wanted her port-A-Cath removed at the  same  time.  Informed consent was obtained with the patient as well as  potential complications of bleeding, infection, and anastomotic leak.  She understood the above and agreed to proceed.   DESCRIPTION OF PROCEDURE:  The patient was brought to the operating room  and placed supine.  After induction of general anesthesia, the right  subclavian port-A-Cath was addressed first.  After sterile prep and  drape of the right subclavian region, a scalpel was used to make a small  incision over the previous scar.  I dissected down and found the port-A-  Cath and was able to cut the sutures, removed them in their entirety,  and removed the port without difficulty.  I used a pursestring suture to  close the tract and then 3-0 Vicryl to close the skin in a subcuticular  fashion.  Dermabond was applied without difficulty.   Next, the abdomen was addressed.  A pursestring suture was placed to  close the colostomy with a 0 silk.  We then  put her in  lithotomy and  placed a folded and an orogastric tube.  The abdomen and perineum were  prepped and draped in a sterile fashion at this point.  Of note, just  below her umbilicus down to her pubic symphysis was a very large  abdominal wall defect which felt like when she was asleep measuring  anywhere from 10 x 15 cm.  After doing this, she was in lithotomy.  She  was appropriately padded.  Compression stockings were used.  She had a  history of previous DVT, but she has had a duplex which showed no  residual clots. We went ahead and used compression stockings, since she  was in lithotomy.  A midline incision was used.  I excised to the right  of the scar.  We opened the fascia and the abdominal cavity and was able  to then use Kochers.  We were able to get up under the previously placed  biologic, and remarkably there were very few adhesions to this.  We then  opened the hernia after opening the fascia all the way down to the pubic  symphysis.  We then  noticed extensive intraabdominal adhesions at this  point and used Metzenbaum scissors to take all of the adhesions down  from the omentum, small bowel, and colon.  We spent about 2 hours taking  adhesions down.  Once we did this, we were able to get all the small  bowel to the pelvis and identify the rectal stump which appeared to be  intact.  Next, we took the colostomy down.  I took all the  intraabdominal adhesions around the colostomy down.  I then used cautery  to excise around the colostomy and reduced this back into the abdominal  cavity.  We then examined the length. We had a very good length of the  colostomy.  We then found that this reached quite nicely down into the  pelvis.  She had a very small colon.  We elected to perform an end-to-  side Baker anastomosis using an EEA stapling device.  We ran the small  bowel at this point, and all adhesions were taken down.  There was one  area of serosal tear which was repaired with a 3-0 silk suture.  We then  examined the rest of the small bowel colon, and other intraabdominal  organs, including the stomach and liver, and felt no other abnormality  or injury.  At this point, we opened the colon and placed an EEA sizer.  Since her colon was so small, a 25 was the only size we could admit.  We  decided to use an Ethicon 25 EEA stapling device.  We then went ahead  and placed the anvil into the descending colon, stapled across the end  of it to close it with a GIA 55, and then we pushed it into the  sidewall.  Next, I went below.  I then performed a rigid proctoscopy.  There was some retained mucus, and I got this cleaned out.  She had a  very small rectum which was somewhat friable.  I was able to advance the  scope up to the staple line.  I then advanced the dilator up this very  carefully.  I went very slowly and gently, not to have any undue trauma  to it.  We were able to advance this and mildly dilate the rectum.  I  placed a  finger in her vagina to make sure  we were in the proper  orifice, and we were.  Of note, her uterus was still there as well.  We  placed traction sutures in the uterus to pull it out of the way as well  as into the distal rectal stump.  I then insufflated the rectal stump  and poured water into the pelvis. I found no evidence of injury from our  manipulation or air leakage.  We then pulled the scope out.  I then was  able to advance the stapling device, but we found there was an area near  the anastomosis which the stapling device did not go through.  I elected  to amputate a small portion of the rectal stump to help make the  anastomosis lay better and get through this narrowing which was near the  old staple line after her first procedure.  We took the mesentery down  with a LigaSure.  We then used a Contour EEA stapling device to amputate  the distal part of the rectum.  We then were found hemostasis to be  excellent.  We then placed our Ethicon EA 25 stapling device up the  stump, and this actually came up to the end of the new staple line quite  nicely.  We then deployed the spike, hooked the anvil until it closed  down, and fired the stapling device without difficulty.  I then removed  the stapling device.  We then placed the proctoscope back, insufflated  with air, filled the pelvis with fluid, and clamped off the colon just  above the staple line.  There was a small area of leakage in the left  lateral and left posterior area.  We were able to use 3-0 silks to  oversew this quite nicely, and then reinsufflated and saw no leak at all  from the anastomosis.  There was absolutely no tension whatsoever, and  the blood supply appeared excellent to both ends.  This laid very nicely  in the pelvis.  We then reinsufflated a second time and again saw no  leakage of air from the anastomosis.  Of note, the inferior doughnut was  complete. The superior doughnut was the one that was thin.   Again, I  think we addressed this with the sutures laterally since this was where  the air was leaking from.  After we did this, there was no air leakage.  The anastomosis by rigid sigmoidoscopy appeared to be completely intact.  At this point, we irrigated out the pelvis and found hemostasis to be  excellent.  I elected to place a drain in the pelvis due to our  dissection and eased this through a separate stab incision in the right  lower quadrant and placed this down near anastomosis posterior to the  uterus.  The anastomosis was at least 15 cm, it looked like.   At this point, we assessed her abdominal wall.  Unfortunately, her  rectus had receded, especially toward her right lower quadrant  significantly.  She had lost a significant amount of abdominal domain  from her umbilicus down.  I felt that she would need to have a lateral  release done of her external oblique at the minimum.  We went ahead and  mobilized the scar of the skin at the abdominal wall, both the left and  right sides, basically from just at her pubis up to her xiphoid.  Once  we did this, we were able to assess the fascial defect, and it was quite  large, measuring at least 10 x 15 cm, and she had lost some of her  abdominal domain.  I felt that a lateral release was necessary.  After  mobilizing the skin, I went laterally and used the cautery to open the  fascia of the external oblique to help release the rectus abdominis  muscle to the midline.  We did this from just below the ribcage all the  way down to the pubis on both sides.  This helped to mobilize the muscle  back to the midline.  This actually gained [place] about 3-4 cm on each side.  I then decided to use a biologic to underlay this.  I used a 16 x 20  Ethicon biologic.  We then secured this with transfascial sutures using  #1 Novofil circumferentially, with a least 5 cm of overlap, and then  once we did this, we had the mesh well secured and tied these down  with  no gaps within, between the abdominal wall mesh to have bowel slip  through.  Of note, we secured the pelvic drain with nylon.  This was  placed to bulb suction.  After closure of this, irrigation was used.  I  then ran the fascia closed over the biologic with double-stranded #1  PDS.  Through a separate stab incision., I placed a #19 Blake drain  below the skin flaps.  The colostomy site fascia was closed previously  with #1 Novofil, and then the mesh was laid under that.  I excised some  excess skin from her abdomen to take away her old scar, leaving  hopefully enough skin.  If she has any problems, this will not be an  issue.  I then used suture to tack the flaps down to the abdominal wall  with 3-0 Vicryl.  Through a separate stab wound, a #19 Blake drain was  placed under the skin over the top of the fascia.  We then used a  running 3-0 Vicryl to close the subcutaneous fat.  Skin staples were  used to close all skin incisions.  An abdominal binder was applied.  Drains were placed to suction.  All final counts of sponge, needles, and  instruments were found to be correct at this portion of the case.  The  patient was awakened at that point in time, extubated, and taken to  recovery in satisfactory condition.      Thomas A. Cornett, M.D.  Electronically Signed     TAC/MEDQ  D:  05/21/2007  T:  05/21/2007  Job:  [id number]   cc:   Shirley Friar, MD  Fax: [phone]

## 2011-01-17 NOTE — H&P (Signed)
NAME:  Lori Peters, Lori Peters                 ACCOUNT NO.:  [id number]   MEDICAL RECORD NO.:  [id number]          PATIENT TYPE:  INP   LOCATION:  1423                         FACILITY:  [facility]   PHYSICIAN:  Shirley Friar, MDDATE OF BIRTH:  Nov 23, 1945   DATE OF ADMISSION:  03/12/2007  DATE OF DISCHARGE:                              HISTORY & PHYSICAL   ADMISSION DIAGNOSES:  1. Colon polyps.  2. History of sigmoid colon cancer.  3. History of anastomotic leak requiring a Hartmann's procedure.  4. History fascial dehiscence.  5. Status post appendectomy.  6. 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   HISTORY OF PRESENT ILLNESS:  A 65 year old white female who underwent a  colonoscopy recently that showed colon polyps that could not be removed  at the time of colonoscopy due to continuous Coumadin.  Colonoscopy was  done for diagnostic purposes prior to planned reanastomosis of her colon  and takedown of her ostomy.  Recommendation to Ms. Sane was to go ahead  and have her surgery for reattachment of the colon and ostomy takedown  and then have repeat colonoscopy after healing of that surgery for colon  polyp removal.  The patient requested removal of polyps prior to  surgery, and therefore, she was electively admitted for discontinuation  of Coumadin and starting of heparin prior to polypectomy.  The patient  is without any complaints and is having normal ostomy output.  She  changes the ostomy bag approximately once every five days which is a  normal routine.  Her current INR is 1.5.   MEDICINES:  Coumadin 7.5 mg p.o. daily.   ALLERGIES:  No known drug allergies.   FAMILY HISTORY:  Noncontributory.   REVIEW OF SYSTEMS:  Negative except for as stated above.   SOCIAL HISTORY:  Denies smoking.   PHYSICAL EXAMINATION:  Temperature 98.2, pulse 66, blood pressure  125/73, O2 saturations 99% on room air.  GENERAL:  Alert in no acute stress.  CHEST:  Clear to auscultation  anteriorly.  CARDIOVASCULAR:  Regular rate and rhythm.  ABDOMEN:  Ostomy clean, dry and intact; abdomen soft, nontender,  nondistended, positive bowel sounds; healed midline surgical scar   LABORATORY DATA:  INR 1.5.  PT 18.8 seconds, hemoglobin 12.6, platelet  count 170, white blood count 6.7.  Potassium 4.2, BUN 9, creatinine  0.42.   IMPRESSION:  A 65 year old white female with recently noted colon polyps  that were not removed during colonoscopy at that time due to patient  being on chronic Coumadin.  The patient been admitted electively for  colon polyp removal prior to planned ostomy takedown and reanastomosis  of colon.  We will plan to do colonoscopy on March 13, 2007 after  discussing the risk of the procedure.  The patient will be placed on  heparin continuous infusion, and this will be held 6 hours prior to the  procedure.  Keep on clear liquid diet and n.p.o. after midnight.  After  her colonoscopy and recovery from procedure and restarting of her  Coumadin, she will follow-up with Dr. Luisa Hart as an outpatient  regarding  timing of reanastomosis per his recommendation.      Shirley Friar, MD  Electronically Signed     VCS/MEDQ  D:  03/12/2007  T:  03/13/2007  Job:  [id number]   cc:   Thomas A. Cornett, M.D.  [address]  [place] [address]   Johney Maine  Fax: [phone]

## 2011-01-20 NOTE — Op Note (Signed)
NAME:  Lori Peters, Lori Peters                 ACCOUNT NO.:  [id number]   MEDICAL RECORD NO.:  [id number]          PATIENT TYPE:  AMB   LOCATION:  NESC                         FACILITY:  [facility]   PHYSICIAN:  Thornton Park. Daphine Deutscher, MD  DATE OF BIRTH:  1946-04-09   DATE OF PROCEDURE:  DATE OF DISCHARGE:  08/26/2004                                 OPERATIVE REPORT   PREOPERATIVE DIAGNOSIS:  Palpable right breast mass.   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   PROCEDURE:  Right breast biopsy.   SURGEON:  Luretha Murphy, M.D.   ANESTHESIA:  Monitored anesthesia care.   DESCRIPTION OF PROCEDURE:  Ms. [name] was taken to room 1 at the [facility] and with her assistance this area was palpated and marked.  It was  in the 4 o'clock position of the right breast about two fingerbreadths from  the areolar margin and was discrete and fairly superficial.  I marked that  with indelible ink and then created a radial type small incision right over  this mass within a stretch mark.  This was done after prepping with Betadine  and injecting with lidocaine.  I discovered this to be an encapsulated  lipoma and went ahead and removed that as well as plenty of surrounding  tissue down deep.  I could no longer palpate the mass after I had completed  the excision.  Essentially, there was no bleeding.  I closed the wound with  4-0 Vicryl subcuticularly with Benzoin and Steri-Strips in the skin.  The  patient will be given Vicodin to take for pain and will be followed up in  the office in about three weeks.   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     Matt   MBM/MEDQ  D:  08/26/2004  T:  08/26/2004  Job:  [id number]   cc:   Duke Salvia. Marcelle Overlie, M.D.  [address]  [place]  [place] [address]  Fax: [phone]   Idamae Schuller A. Milinda Antis, M.D. [facility]

## 2011-01-20 NOTE — Op Note (Signed)
NAME:  Lori Peters, Lori Peters NO.:  [id number]   MEDICAL RECORD NO.:  [id number]          PATIENT TYPE:  INP   LOCATION:  0155                         FACILITY:  [facility]   PHYSICIAN:  Thomas A. Cornett, M.D.DATE OF BIRTH:  01-18-1946   DATE OF PROCEDURE:  01/08/2006  DATE OF DISCHARGE:                                 OPERATIVE REPORT   PREOPERATIVE DIAGNOSIS:  Fascial dehiscence of abdominal wall.   POSTOPERATIVE DIAGNOSIS:  1.  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  2.  Interloop abscess.  3.  Inflamed appendix.  4.  Retracted colostomy.   PROCEDURE:  1.  Exploratory laparotomy.  2.  Closure of abdominal wall with Alloderm patch.  3.  Revision of colostomy.  4.  Drainage of interloop abscess.   SURGEON:  Harriette Bouillon, M.D.   ANESTHESIA:  General endotracheal anesthesia.   ESTIMATED BLOOD LOSS:  150 mL.   DRAINS:  None.   INDICATIONS FOR PROCEDURE:  The patient is a 65 year old female who  underwent a sigmoid colectomy.  She had a leak one week ago and was taken to  the operating room for diversion.  She has since developed fascial  dehiscence which requires a return trip to the operating room for fascial  closure and also irrigation and debridement of the abdominal cavity second  look procedure.  The patient has remained hemodynamically stable but given  her fascial dehiscence I felt the reexploration was warranted in this  setting.   DESCRIPTION OF PROCEDURE:  The patient was brought to the operating room and  placed supine.  After induction of general anesthesia, I removed her  dressings from her abdominal wound.  There was  exposed small bowel but  there was no evidence of any fistula.  After sterile prep and drape, I was  able to use my finger to enter the abdominal cavity bluntly.  This was  actually omentum, not small bowel once I entered the abdominal cavity.  I  then was able to use my hand and break up all the loose loculations in the  abdominal cavity especially in the pelvis where there was smaller interloop  abscess near the ileum which I broke up.  This was copiously irrigated out  at this point in time.  I then found the appendix and the tip of it was  stuck in this abscess and was inflamed.  I took down the mesentery to the  appendix with 3-0 Vicryl ties and used a GIA-30 stapler and divided the  appendix.  The stump appeared intact with no signs of bleeding.  This was  passed off the field.  At this point, I ran the small bowel from the  ligament of Treitz down to the turn the ileocecal valve.  I broke up all  loculations of fluid.  No other major abscess were seen.  The colostomy was  retracted, and I took it down and brought back in the abdominal cavity.  I  then was able to mobilize the distal left colon by taking out a small vessel  which gave me a tremendous  amount of length at this point in time.  I  identified the ureters though and was well away from it.  There was some  bleeding from the gonadal vein and I used a clip to control this.  I was  careful to identify the ureter on the left side, and it was well away s well  away from the site of bleeding.  At this point, I irrigated again and  suctioned all of this out.  I then was able to bring the ostomy out of the  same hole and had much more length.  The bowel appeared viable.  At this  point, we reinspected the abdominal cavity.  I irrigated the upper abdominal  quadrant and suctioned this out.  I then replaced the bowel back in the  abdominal cavity and placed the omentum on top of it.  I examined her  abdominal wall, and it was somewhat necrotic, and I debrided the edges of  it.  She had lost abdominal domain and felt that the only the way to close  her at this point in time is with a piece of Alloderm.  Two pieces of  Alloderm were used and sewn together.  This was sutured to her abdominal  wall with interrupted #1 Novofil suture circumferentially.  This  closed the  abdominal wall quite nicely and gave her a considerable amount of strength  at this point in time.  A wet-to-dry dressing was placed over this after  this was done after irrigation was used.  I put a few interrupted sutures to  approximate the skin edges.  At this point in time, her ostomy was matured  and looked good.  I was able to get a rose bud appearance to it and matured  with 4-0 Monocryl.  An ostomy appliance was placed over the colostomy site.  All final counts of sponge, needle and instruments were found to be correct  at this portion of the case.      Thomas A. Cornett, M.D.  Electronically Signed     TAC/MEDQ  D:  01/08/2006  T:  01/09/2006  Job:  [id number]

## 2011-01-20 NOTE — Op Note (Signed)
NAME:  Lori Peters, Lori Peters NO.:  [id number]   MEDICAL RECORD NO.:  [id number]          PATIENT TYPE:  AMB   LOCATION:  DAY                          FACILITY:  [facility]   PHYSICIAN:  Thomas A. Cornett, M.D.DATE OF BIRTH:  [birth year]   DATE OF PROCEDURE:  05/15/2006  DATE OF DISCHARGE:                                 OPERATIVE REPORT   PREOPERATIVE DIAGNOSIS:  Poor venous access.   POSTOPERATIVE DIAGNOSIS:  Poor venous access.   PROCEDURE:  Placement of 8-French right subclavian Bard Slim infusion port  with fluoroscopy.   SURGEON:  Harriette Bouillon, MD.   ANESTHESIA:  MAC with 8 mL of 0.25% Sensorcaine with epinephrine local.   ESTIMATED BLOOD LOSS:  26 mL.   INDICATIONS FOR PROCEDURE:  The patient is a 65 year old female who has  stage III colon cancer.  She is here today for placement of Port-A-Cath for  venous access and subsequent chemotherapy.  The procedure and complications  were discussed preoperatively with the patient and her family. They voiced  understanding and agreed to proceed.   DESCRIPTION OF PROCEDURE:  The patient brought to the operating room placed  supine.  Both arms were tucked and a roll was placed under the patient's  shoulders.  The upper chest and neck were prepped and draped in sterile  fashion.  The right subclavian region was chosen.  8-French Bard unattached  catheter was brought on the field.  The patient was placed in Trendelenburg  and sedated.  The right subclavian vein was cannulated easily and the wire  was fed through the needle without difficulty.  The blood was dark and non  pulsatile.  Fluoroscopy was used was visualized the wire going down the  subclavian vein into the superior vena cava down through into the inferior  vena cava.  She had no ectopy.   Next, the patient was placed flat.  Incision was made below the clavicle  where the wire was placed.  A small cavity was created down to the fascia of  the pectoralis major  for placement of the port.  The Port-A-Cath was then  flushed and attached to the actual catheter and port.  This was secured  quite tightly.  After making sure the catheter was flushed, I tunneled the  catheter from the lower incision to the upper wire exit site.  We then cut  this to about 17 cm which corresponded to her angle of Louie.  The patient  was placed back in Trendelenburg.  I was able to slightly dilate then over  the wire moving the wire to-and-fro without any resistance.  I then removed  the dilator and placed the dilator back into the introducer.  The introducer  dilator complex was then slid over the wire with gentle pressure.  The wire  was moved to-and-fro and I felt no resistance.  Its entire complex slid in  very easily.  I then removed the dilator and wire leaving the introducer in  place.  The catheter was then placed into the introducer and peel-away  sheath was peeled away.  Fluoroscopy was used which verified no kink in the  catheter and appeared to be the superior vena cava of the right atrium.  Catheter was then flushed with heparinized saline and it drew back quite  easily.  I then put 5 mL of 100 units of heparin per cc into the ports at  this point.  I secured the port to the pectoralis major fascia using 2-0  Prolene.  3-0 Vicryl was used to close the lower incision in a deep layer  and 4-0 Monocryl was used to close the skin.  The port entrance site for the  needle was closed with 4-0 Monocryl.  Steri-Strips and  occlusive dressing were then applied.  The patient was awoke, taken to  recovery in satisfactory condition.  Chest x-ray will be obtained.  All  final counts of sponge, needle and instruments were found be correct at this  portion of the case.      Thomas A. Cornett, M.D.  Electronically Signed     TAC/MEDQ  D:  05/15/2006  T:  05/16/2006  Job:  [id number]   cc:   Dr. __________  __________ [facility]

## 2011-01-20 NOTE — Op Note (Signed)
NAME:  Lori Peters, Lori Peters NO.:  [id number]   MEDICAL RECORD NO.:  [id number]          PATIENT TYPE:  INP   LOCATION:  0004                         FACILITY:  [facility]   PHYSICIAN:  Thomas A. Cornett, M.D.DATE OF BIRTH:  [birth year]   DATE OF PROCEDURE:  12/28/2005  DATE OF DISCHARGE:                                 OPERATIVE REPORT   PREOPERATIVE DIAGNOSIS:  Sigmoid colon mass.   POSTOPERATIVE DIAGNOSIS:  Sigmoid colon mass.   PROCEDURE:  Laparoscopic-assisted sigmoid colectomy.   SURGEON:  Maisie Fus A. Cornett, M.D.   ANESTHESIA:  General endotracheal anesthesia.   ASSESSMENT:  Lori Peters, M.D.   ESTIMATED BLOOD LOSS:  60 cc.   SPECIMENS:  Sigmoid colon segment sent to pathology, and on my gross  examination had grossly clear margins up to 4 cm.   DRAINS:  None.   INDICATIONS FOR PROCEDURE:  The patient is a 65 year old female found to  have a sigmoid colon mass on screening colonoscopy.  Biopsy of the findings  was suspicious for adenocarcinoma of the sigmoid colon, but this could not  be definitively decided.  I saw the patient, reviewed her study, examined  the patient, and discussed the findings, and recommended a sigmoid  colectomy.  I felt this could be done laparoscopically assisted.  After  discussion of this with the patient, she understood the above risks of  bleeding, infection, anastomotic breakdown, injury to other organs, and  cardiovascular comorbidity.  She understood and agreed to proceed.   DESCRIPTION OF PROCEDURE:  The patient was brought to the operating room and  placed supine.  After induction of general endotracheal anesthesia, the  patient was placed in the lithotomy position.  Her abdomen and perineum were  prepped and draped in a sterile fashion.  A 1 cm infraumbilical incision was  used.  Dissection was carried to her fascia, and a small incision was made  in her fascia.  Pursestring suture of 0 Vicryl was placed, and an 11  mm  Hasson cannula was placed under direct vision.  Pneumoperitoneum was created  to 15 mmHg of CO2, and a laparoscope was placed.  Laparoscopy was performed.  There were some lower omental abdominal adhesions to the anterior abdominal  wall noted.  I then placed an 11 mm subxiphoid port, and I also placed two 5  mm ports, one in the left lower quadrant and a second just above the pubic  symphysis in line.  Using the Harmonic scalpel, I took down the omental  adhesions from the anterior abdominal wall.  The patient was then placed in  reverse Trendelenburg and rolled toward her right.  I started by taking down  the splenic flexure.  The splenic flexure was taken down using the Harmonic  scalpel.  I then began to mobilize the left colon along the white line of  Toldt using the Harmonic scalpel down to the pelvis.  At this point, I was  able to mobilize the sigmoid colon from the lateral wall.  She had some old  adhesions from previous gynecological surgery and what appeared  to be some  diverticular changes to her sigmoid colon.  I used the Harmonic scalpel to  further mobilize the sigmoid colon away.  I was able to then bluntly dissect  the colon from its retroperitoneal attachments.  I was able to identify the  gonadal vessels as well as the left ureter and preserve it.  Once I fully  mobilized the left colon an adequate length, the tumor was somewhere around  30 cm, but I could not visualize it laparoscopically.  I did, though, feel I  had this area well mobilized, and I was able at this point in time to  externalize the sigmoid colon.  At this point in time, I removed the  laparoscopic instruments and made a lower midline incision measuring roughly  7 cm.  Once into the abdominal cavity, I was able to grab the sigmoid colon.  I used a sterile sponge as a wound protector and then pulled the sigmoid  colon out with the wound protected.  I could palpate the mass.  This was in  the midsigmoid  colon, it looked like.  I had adequate length after my  mobilization.  I then elected to resect the specimen, making sure I had  grossly, it felt like, 5 cm margins on both sides of it.  I used a GIA 75  stapler to divide the distal end and the proximal end.  Roughly 10 cm of  colon was resected.  This was all she had for a sigmoid colon, it looked  like, after this was mobilized.  I then took the mesentery down to the root  of the mesenteric vessels using a combination of 2-0 and 3-0 Vicryl ties and  sutured as needed to oversew vessels.  I took this down to the root of the  mesentery as far as I could.  I could see that I was down to the  retroperitoneum, it looked like.  The entire specimen was sent to pathology,  and gross examination revealed the margins to be clear.  There was a 4 cm  distal margin and a 6 cm proximal margin.  I did not feel I needed to go any  further, since I got a good mesenteric dissection, and my margins were well  clear of the tumor, it looked like.  At this point in time, I had absolutely  no tension whatsoever, and the two segments of colon lay right next to each  other, and I could actually lay them past each other's ends with no tension  whatsoever.  I then opened each stapled end with the cautery.  I then sewed  an end-to-end anastomosis with 3-0 Vicryl interrupted suture.  There was no  tension whatsoever.  I took very large full-thickness bites.  The colon  seemed quite viable, with no signs of ischemia.  At this point in time,  after the anastomosis was constructed, I palpated it, and it had a good  lumen.  I did not see any signs of ischemia or any tension whatsoever.  Tisseel was used on the anastomosis.  This was applied both to the anterior  and posterior wall.  At this point in time, I dropped the anastomosis back  in the abdominal cavity.  I then closed the fascial defect here with a  running #1 Vicryl suture.  I irrigated the subcutaneous tissues  with a combination of saline and weak Betadine.  Next, I reinserted the scope  through the umbilical port, again reinflated the  abdominal cavity.  Inspection of the abdominal cavity revealed that the anastomosis lay in the  upper pelvis with no tension whatsoever.  It appeared hemostatic, with no  signs of tension or ischemia.  Irrigation was used and suctioned out, and  this was clear.  I reinspected the retroperitoneum and saw no signs of  active bleeding, and the ureter was in its proper place.  After inspection  of the small bowel and the remainder of the colon, this appeared normal.  Stomach appeared normal.  Inspection of the liver revealed no mass lesion  grossly on the liver that I could see.  I did not see any signs of injury to  the other structures, including the bladder.  At this point in time, all  irrigation was suctioned out.  The ports were subsequently removed.  The CO2  was released.  The umbilical port was closed with a pursestring suture of 0  Vicryl.  Skin staples were used to close the skin incisions.  Sterile  dressings were applied.  The patient was taken out of lithotomy and  transported to the recovery room in satisfactory condition.      Thomas A. Cornett, M.D.  Electronically Signed     TAC/MEDQ  D:  12/28/2005  T:  12/29/2005  Job:  [id number]   cc:   Duke Salvia. Marcelle Overlie, M.D.  Fax: [phone]   Shirley Friar, MD  Fax: [phone]

## 2011-01-20 NOTE — Op Note (Signed)
NAME:  Lori Peters, Lori Peters NO.:  [id number]   MEDICAL RECORD NO.:  [id number]          PATIENT TYPE:  INP   LOCATION:  1610                         FACILITY:  [facility]   PHYSICIAN:  Clovis Pu. Peters, M.D.DATE OF BIRTH:  1945-12-26   DATE OF PROCEDURE:  01/02/2006  DATE OF DISCHARGE:                                 OPERATIVE REPORT   PREOPERATIVE DIAGNOSIS:  Postoperative abdominal pain.   POSTOPERATIVE DIAGNOSIS:  Anastomotic leak at sigmoid colon resection site  with interloop abscess.   PROCEDURES:  1.  Diagnostic laparoscopy.  2.  Exploratory laparotomy with takedown of the anastomosis and end      colostomy with Hartmann's pouch.   SURGEON:  Maisie Fus A. Peters, M.D.   ASSISTANT:  Sandria Bales. Ezzard Standing, M.D.   ANESTHESIA:  General endotracheal anesthesia.   ESTIMATED BLOOD LOSS:  50 mL.   DRAINS:  None.   SPECIMENS:  None.   INDICATIONS FOR PROCEDURE:  The patient is a 65 year old female 5 days out  from a laparoscopic assisted sigmoid colectomy for colon cancer.  She is  doing very well until last night when she developed severe sudden abdominal  pain, nausea, vomiting.  CT scan showed free air and free fluid and I was  concerned she had an anastomotic leak.  She was taken emergently to the  operating room today for exploration.   DESCRIPTION OF PROCEDURE:  The patient was brought to the operating room and  placed supine.  After induction of general endotracheal anesthesia, the  abdomen was prepped and draped in a sterile fashion.  Old staples were  removed from all of her incisions.  I used her umbilical incision for  placement of a 11 mm Hassan cannula for diagnostic laparoscopy.  Pneumoperitoneum was created 15 mmHg and laparoscope was placed.  I did not  see much obvious pus in the abdomen but there was a walled off area adjacent  to the anastomosis.  I was able to pull the omentum away and free fluid was  encountered but I did not see any stool.  Fluid  was turbid.  I examined the  anastomosis and it appeared ischemic.  I did not see any signs of tension  but given its appearance and the amount of fluid in the abdomen, I felt that  conversion to an open procedure was warranted for an evaluation.  At this  point in time, I removed the camera.  I also placed a 5 mm trocar through  the midline incision and removed it as well.   I had opened the previous lower abdominal incision.  I extended the incision  up to the umbilicus and down to the pubic symphysis. On entering the  abdomen, there were copious amounts of turbid fluid.  I was able to finding  anastomosis.  The sutures were all intact and there was no tension on the  anastomosis but the tissue appeared necrotic and almost ischemia and  liquefied.  The anterior wall throughout showed this.  There was also a  right lower quadrant interloop abscess and I was able to  put my hand in  that, open it up and pull up the cecum which was stuck in that as well.  I  then used a GIA75 stapler to fire just distal to the anastomosis.  At this  point, I did not see any obvious leakage from the anastomosis but it was not  healthy and had a leak at some point.  I put a Babcock on this and used to  pull through for my colostomy.  I then irrigated the abdominal cavity with  over 5 liters of sterile saline and suctioned it out until clear.  I  irrigated up above the liver and near the spleen as well.  This was  irrigated out until clear.  I reinspected the staple line of the Hartmann's  pouch and it appeared intact and was not ischemic.  I then created a left  lower quadrant incision using a Kocher in the skin.  Ellipse of skin was  excised.  Dissection was carried down to the fascia of her abdominal wall.  I opened the fascia of her abdominal wall full-thickness and then passed a  Babcock through to grab the colon and pull it through.  It pulled through  fairly easily.  The suture line of the anastomosis  was pulled through and  there was some healthy colon below this.  The secured the healthy colon to  the fascia to keep it from sliding back in.  It was somewhat retracted, but  given the amount of inflammation, I felt it was the best we could do.  Next,  after irrigating the abdominal cavity copiously, we made sure we suctioned  all this out, reinspected the bowel to make sure there were no other  interloop abscesses which there were not.  At this point in time, I closed  the abdominal wall with a #1 running PDS with interrupted sutures of 0  Vicryl was well to buttress a closure.  I then loosely approximated the skin  edges and packed the open wound.  The colostomy was then matured using a 4-0  Monocryl.  The ostomy did appear edematous but was viable and was somewhat  retracted but I was able to place my finger through fascia and down into the  colon without difficulty.  The patient remained hemodynamically stable  throughout the case.  She was making urine, had a good blood pressure in the  low 100s.  At this point in time, the patient was extubated and taken to  recovery in guarded but stable condition.      Lori Peters, M.D.  Electronically Signed     TAC/MEDQ  D:  01/02/2006  T:  01/03/2006  Job:  [id number]

## 2011-01-20 NOTE — Discharge Summary (Signed)
NAME:  Lori Peters, MESTAS NO.:  [id number]   MEDICAL RECORD NO.:  [id number]          PATIENT TYPE:  INP   LOCATION:  1610                         FACILITY:  [facility]   PHYSICIAN:  Clovis Pu. Cornett, M.D.DATE OF BIRTH:  [birth year]   DATE OF ADMISSION:  12/28/2005  DATE OF DISCHARGE:  01/29/2006                                 DISCHARGE SUMMARY   ADMITTING DIAGNOSIS:  Sigmoid colon cancer.   DISCHARGE DIAGNOSES:  1.  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  2.  Anastomotic leak requiring a Hartman's procedure.  3.  Fascial dehiscence requiring closure of abdominal wound with Alloderm      and vac placement.  4.  Sepsis.  5.  Bilateral pleural effusions.   PROCEDURES PERFORMED:  1.  Laparoscopic-assisted sigmoid colectomy on December 28, 2005.  2.  Re-exploratory laparotomy with Hartman's procedure on Jan 02, 2006.  3.  Exploratory laparotomy with closure of fascial dehiscence and drainage      of interloop abscess and incidental appendectomy on Jan 08, 2006.   BRIEF HISTORY:  The patient is a 65 year old female who was diagnosed with a  sigmoid colon cancer.  This was done by colonoscopic means.  She was  referred for surgical consultation.  She was brought to the hospital on  December 28, 2005, for laparoscopic-assisted sigmoid colectomy.   HOSPITAL COURSE:  Please see details of operative notes for above  procedures.  On December 28, 2005, she underwent a laparoscopic-assisted  sigmoid colectomy.  The procedure went well.  On postoperative day four, she  developed increasing abdominal pain with ileus.  CT scan showed free air and  free fluid worrisome for anastomotic breakdown.  She was taken to the  operating room.  A laparoscope was placed and pus was found.  A laparotomy  was performed which showed anterior wall dehiscence of her anastomosis.  A  Hartman's procedure was performed.  She was washed out and then taken to the  intensive care unit.  Over the  next four days, her sepsis syndrome resolved.  CCM was consulted to help with sepsis and she required inotropic support but  did not require mechanical ventilation.  Antibiotics were then placed and  Citrobacter grew out from her cultures.  After four days in the ICU, she had  drainage from the lower midline wound.  She had intermittent staples placed  and these were taken out and fascia dehiscence was encountered.  She was  returned to the operating room at this point in time and exploratory  laparotomy was performed.  There was still an interloop abscess in her low  pelvis that was washed out, and her ostomy was also retracted and this was  revised at that point in time.  Unfortunately, she had significant necrosis  of the anterior sheath and Alloderm was sewn to close her abdomen.  At this  point in time, wet-to-dry dressing changes were begun.  She was placed back  in the ICU for the next week, undergoing wound care and recovering from  sepsis.  TNA was initiated for nutrition which  she tolerated well.  After  ten days in the ICU, she was transferred to the floor where she underwent  wound care for the next 2-1/2 weeks and nutritional supplementation.  Her  ostomy was working well and after numerous vac changes, the Alloderm was  showing signs of ingrowth of tissue.  Her wound began to heal very nicely at  this point in time.  Her antibiotics were discontinued after two weeks.  She  was also maintained on Diflucan and this was stopped.  She was tolerating a  regular diet at this point in time of discharge and was discharged home on  Jan 29, 2006, in satisfactory condition.  She was very comfortable for  ostomy care and wound vac was in place, and home health was put in place for  that.   DISCHARGE INSTRUCTIONS:  The patient will follow up the day after tomorrow  for me to check her vac.  Home health is already aware of her.  The only  medications she will need at home is Vicodin for  pain.  She will resume a  regular diet and avoid any heavy exertion.  She will be able to take a  sponge bath.   CONDITION AT DISCHARGE:  Satisfactory.      Thomas A. Cornett, M.D.  Electronically Signed     TAC/MEDQ  D:  01/29/2006  T:  01/29/2006  Job:  [id number]   cc:   Shirley Friar, MD  Fax: [phone]

## 2011-06-15 LAB — CBC
HCT: 27.2 — ABNORMAL LOW
HCT: 34.8 — ABNORMAL LOW
Hemoglobin: 9 — ABNORMAL LOW
Hemoglobin: 9.1 — ABNORMAL LOW
Hemoglobin: 9.3 — ABNORMAL LOW
Hemoglobin: 9.4 — ABNORMAL LOW
Hemoglobin: 9.6 — ABNORMAL LOW
MCHC: 34
MCHC: 34
MCHC: 34.1
MCHC: 34.2
MCHC: 34.2
MCHC: 34.5
MCV: 86.7
MCV: 87.3
MCV: 87.8
MCV: 87.9
Platelets: 149 — ABNORMAL LOW
Platelets: 195
RBC: 3.06 — ABNORMAL LOW
RBC: 3.23 — ABNORMAL LOW
RDW: 13.9
RDW: 13.9
RDW: 14
WBC: 7.3

## 2011-06-15 LAB — COMPREHENSIVE METABOLIC PANEL
AST: 28
Alkaline Phosphatase: 36 — ABNORMAL LOW
BUN: 5 — ABNORMAL LOW
CO2: 29
Calcium: 8 — ABNORMAL LOW
Chloride: 103
Creatinine, Ser: 0.68
GFR calc non Af Amer: >60
Glucose, Bld: 123 — ABNORMAL HIGH
Glucose, Bld: 163 — ABNORMAL HIGH
Potassium: 3.5
Total Bilirubin: 0.6
Total Protein: 4.2 — ABNORMAL LOW
Total Protein: 4.7 — ABNORMAL LOW

## 2011-06-15 LAB — DIFFERENTIAL
Basophils Absolute: 0
Basophils Absolute: 0
Basophils Relative: 0
Basophils Relative: 0
Eosinophils Absolute: 0
Eosinophils Relative: 0
Lymphocytes Relative: 14
Lymphs Abs: 2.2
Neutro Abs: 12.8 — ABNORMAL HIGH

## 2011-06-15 LAB — BASIC METABOLIC PANEL
BUN: 1 — ABNORMAL LOW
CO2: 33 — ABNORMAL HIGH
Chloride: 101
Glucose, Bld: 126 — ABNORMAL HIGH
Potassium: 3.6
Sodium: 136

## 2011-06-15 LAB — PROTIME-INR
INR: 0.9
INR: 1.1
INR: 1.4
Prothrombin Time: 12.3
Prothrombin Time: 14.2

## 2011-06-15 LAB — APTT: aPTT: 28

## 2011-06-16 LAB — COMPREHENSIVE METABOLIC PANEL
AST: 25
Albumin: 4.1
Alkaline Phosphatase: 52
CO2: 29
Chloride: 104
GFR calc Af Amer: >60
GFR calc non Af Amer: >60
Potassium: 3.9
Total Bilirubin: 1.1

## 2011-06-16 LAB — DIFFERENTIAL
Basophils Absolute: 0
Basophils Relative: 1
Eosinophils Absolute: 0
Eosinophils Relative: 1
Monocytes Absolute: 0.3

## 2011-06-16 LAB — CBC
HCT: 38.1
Platelets: 221
RBC: 4.34
WBC: 6.9

## 2011-06-19 LAB — CBC
HCT: 33.2 — ABNORMAL LOW
Platelets: 177
RDW: 13.7

## 2011-06-19 LAB — HEPARIN LEVEL (UNFRACTIONATED): Heparin Unfractionated: 0.58

## 2011-06-19 LAB — PROTIME-INR
INR: 1.7 — ABNORMAL HIGH
Prothrombin Time: 20.8 — ABNORMAL HIGH

## 2011-06-20 LAB — PROTIME-INR
INR: 1.4
INR: 1.5
Prothrombin Time: 14.7
Prothrombin Time: 16.5 — ABNORMAL HIGH
Prothrombin Time: 18.8 — ABNORMAL HIGH
Prothrombin Time: 19.1 — ABNORMAL HIGH

## 2011-06-20 LAB — BASIC METABOLIC PANEL
BUN: 4 — ABNORMAL LOW
BUN: 6
BUN: 6
BUN: 9
CO2: 25
CO2: 26
Calcium: 7.9 — ABNORMAL LOW
Calcium: 8.1 — ABNORMAL LOW
Calcium: 8.8
Chloride: 112
Creatinine, Ser: 0.42
Creatinine, Ser: 0.49
Creatinine, Ser: 0.49
Creatinine, Ser: 0.52
GFR calc Af Amer: >60
GFR calc Af Amer: >60
GFR calc non Af Amer: >60
GFR calc non Af Amer: >60
GFR calc non Af Amer: >60
GFR calc non Af Amer: >60
Glucose, Bld: 102 — ABNORMAL HIGH
Glucose, Bld: 96
Glucose, Bld: 99
Glucose, Bld: 99
Potassium: 3.6
Potassium: 3.9
Potassium: 4.2
Sodium: 141

## 2011-06-20 LAB — CBC
HCT: 30.7 — ABNORMAL LOW
HCT: 32.1 — ABNORMAL LOW
HCT: 34.7 — ABNORMAL LOW
HCT: 35.3 — ABNORMAL LOW
Hemoglobin: 10.5 — ABNORMAL LOW
Hemoglobin: 11.1 — ABNORMAL LOW
Hemoglobin: 12
Hemoglobin: 12.6
MCHC: 34.3
MCHC: 34.5
MCHC: 34.6
MCHC: 34.8
MCHC: 34.8
MCHC: 35
MCHC: 35.1
MCV: 86.6
MCV: 86.8
MCV: 87
MCV: 87.4
MCV: 87.5
MCV: 87.6
Platelets: 140 — ABNORMAL LOW
Platelets: 143 — ABNORMAL LOW
Platelets: 149 — ABNORMAL LOW
Platelets: 155
Platelets: 170
RBC: 3.54 — ABNORMAL LOW
RBC: 3.67 — ABNORMAL LOW
RBC: 3.96
RBC: 4.02
RDW: 13.1
RDW: 13.3
RDW: 13.7
RDW: 13.9
WBC: 6
WBC: 6.5

## 2011-06-20 LAB — HEPARIN LEVEL (UNFRACTIONATED)
Heparin Unfractionated: 0.32
Heparin Unfractionated: 0.32
Heparin Unfractionated: 0.4

## 2011-06-20 LAB — HEMOGLOBIN AND HEMATOCRIT, BLOOD
HCT: 32.1 — ABNORMAL LOW
HCT: 34.7 — ABNORMAL LOW
Hemoglobin: 11.2 — ABNORMAL LOW
Hemoglobin: 12

## 2011-08-10 ENCOUNTER — Ambulatory Visit: Payer: Self-pay

## 2012-01-25 ENCOUNTER — Ambulatory Visit: Payer: Self-pay | Admitting: Oncology

## 2012-01-25 LAB — COMPREHENSIVE METABOLIC PANEL
Albumin: 3.6 g/dL (ref 3.4–5.0)
Alkaline Phosphatase: 50 U/L (ref 50–136)
BUN: 13 mg/dL (ref 7–18)
Bilirubin,Total: 0.3 mg/dL (ref 0.2–1.0)
Calcium, Total: 8.7 mg/dL (ref 8.5–10.1)
Creatinine: 0.61 mg/dL (ref 0.60–1.30)
EGFR (African American): >60
EGFR (Non-African Amer.): >60
Glucose: 99 mg/dL (ref 65–99)
Osmolality: 281 (ref 275–301)
SGOT(AST): 18 U/L (ref 15–37)
SGPT (ALT): 21 U/L
Sodium: 141 mmol/L (ref 136–145)

## 2012-01-25 LAB — CBC CANCER CENTER
Basophil #: 0.1 x10 3/mm (ref 0.0–0.1)
Basophil %: 0.8 %
HCT: 37.3 % (ref 35.0–47.0)
Lymphocyte #: 2.8 x10 3/mm (ref 1.0–3.6)
Lymphocyte %: 41.3 %
MCH: 29.5 pg (ref 26.0–34.0)
MCHC: 33.2 g/dL (ref 32.0–36.0)
MCV: 89 fL (ref 80–100)
Monocyte #: 0.4 x10 3/mm (ref 0.2–0.9)
Monocyte %: 6.4 %
Neutrophil %: 50.4 %
Platelet: 186 x10 3/mm (ref 150–440)
RBC: 4.19 10*6/uL (ref 3.80–5.20)
RDW: 13.7 % (ref 11.5–14.5)

## 2012-01-26 LAB — CEA: CEA: 1.3 ng/mL (ref 0.0–4.7)

## 2012-02-03 ENCOUNTER — Ambulatory Visit: Payer: Self-pay | Admitting: Oncology

## 2012-05-08 ENCOUNTER — Other Ambulatory Visit: Payer: Self-pay | Admitting: Gastroenterology

## 2013-01-02 ENCOUNTER — Ambulatory Visit: Payer: Self-pay | Admitting: Oncology

## 2013-01-21 LAB — CBC CANCER CENTER
Basophil #: 0 x10 3/mm (ref 0.0–0.1)
Basophil %: 0.8 %
Eosinophil #: 0 x10 3/mm (ref 0.0–0.7)
Eosinophil %: 0.8 %
HCT: 38.5 % (ref 35.0–47.0)
HGB: 12.9 g/dL (ref 12.0–16.0)
Lymphocyte #: 2.6 x10 3/mm (ref 1.0–3.6)
Lymphocyte %: 43.7 %
MCH: 29.6 pg (ref 26.0–34.0)
MCHC: 33.4 g/dL (ref 32.0–36.0)
MCV: 88 fL (ref 80–100)
Monocyte #: 0.5 x10 3/mm (ref 0.2–0.9)
Monocyte %: 7.9 %
Neutrophil #: 2.7 x10 3/mm (ref 1.4–6.5)
Neutrophil %: 46.8 %
Platelet: 189 x10 3/mm (ref 150–440)
RBC: 4.36 10*6/uL (ref 3.80–5.20)
RDW: 13.8 % (ref 11.5–14.5)
WBC: 5.9 x10 3/mm (ref 3.6–11.0)

## 2013-01-21 LAB — COMPREHENSIVE METABOLIC PANEL
Albumin: 3.8 g/dL (ref 3.4–5.0)
Anion Gap: 10 (ref 7–16)
Bilirubin,Total: 0.4 mg/dL (ref 0.2–1.0)
Calcium, Total: 8.8 mg/dL (ref 8.5–10.1)
Chloride: 104 mmol/L (ref 98–107)
Co2: 26 mmol/L (ref 21–32)
EGFR (African American): >60
EGFR (Non-African Amer.): >60
Glucose: 96 mg/dL (ref 65–99)
Osmolality: 280 (ref 275–301)
SGOT(AST): 20 U/L (ref 15–37)
SGPT (ALT): 23 U/L (ref 12–78)
Total Protein: 7.2 g/dL (ref 6.4–8.2)

## 2013-02-02 ENCOUNTER — Ambulatory Visit: Payer: Self-pay | Admitting: Oncology

## 2013-03-04 ENCOUNTER — Ambulatory Visit: Payer: Self-pay | Admitting: Oncology

## 2013-04-18 LAB — COMPREHENSIVE METABOLIC PANEL
Alkaline Phosphatase: 58 U/L (ref 50–136)
Anion Gap: 9 (ref 7–16)
Bilirubin,Total: 0.5 mg/dL (ref 0.2–1.0)
Calcium, Total: 10 mg/dL (ref 8.5–10.1)
EGFR (African American): >60
Glucose: 198 mg/dL — ABNORMAL HIGH (ref 65–99)
SGOT(AST): 26 U/L (ref 15–37)
SGPT (ALT): 24 U/L (ref 12–78)
Total Protein: 8.9 g/dL — ABNORMAL HIGH (ref 6.4–8.2)

## 2013-04-18 LAB — CBC
HCT: 48.9 % — ABNORMAL HIGH (ref 35.0–47.0)
HGB: 16.3 g/dL — ABNORMAL HIGH (ref 12.0–16.0)
MCH: 29.3 pg (ref 26.0–34.0)
MCV: 88 fL (ref 80–100)
WBC: 20.4 10*3/uL — ABNORMAL HIGH (ref 3.6–11.0)

## 2013-04-18 LAB — LIPASE, BLOOD: Lipase: 139 U/L (ref 73–393)

## 2013-04-19 ENCOUNTER — Inpatient Hospital Stay: Payer: Self-pay | Admitting: Internal Medicine

## 2013-04-19 LAB — URINALYSIS, COMPLETE
Bacteria: NONE SEEN
Bilirubin,UR: NEGATIVE
Nitrite: NEGATIVE
Ph: 5 (ref 4.5–8.0)
Specific Gravity: 1.01 (ref 1.003–1.030)
WBC UR: 1 /HPF (ref 0–5)

## 2013-04-20 LAB — BASIC METABOLIC PANEL
BUN: 4 mg/dL — ABNORMAL LOW (ref 7–18)
Calcium, Total: 7.2 mg/dL — ABNORMAL LOW (ref 8.5–10.1)
Co2: 27 mmol/L (ref 21–32)
Osmolality: 282 (ref 275–301)
Potassium: 3.9 mmol/L (ref 3.5–5.1)

## 2013-04-20 LAB — CBC WITH DIFFERENTIAL/PLATELET
Basophil #: 0.1 10*3/uL (ref 0.0–0.1)
Basophil %: 0.8 %
Eosinophil #: 0.1 10*3/uL (ref 0.0–0.7)
Lymphocyte #: 3.3 10*3/uL (ref 1.0–3.6)
Lymphocyte %: 47.5 %
MCH: 30 pg (ref 26.0–34.0)
MCHC: 34.2 g/dL (ref 32.0–36.0)
Monocyte %: 6.6 %
Neutrophil %: 44.1 %
Platelet: 168 10*3/uL (ref 150–440)
RDW: 13.4 % (ref 11.5–14.5)
WBC: 6.9 10*3/uL (ref 3.6–11.0)

## 2013-04-24 LAB — CULTURE, BLOOD (SINGLE)

## 2013-04-30 ENCOUNTER — Encounter: Payer: Self-pay | Admitting: Family Medicine

## 2013-04-30 ENCOUNTER — Ambulatory Visit (INDEPENDENT_AMBULATORY_CARE_PROVIDER_SITE_OTHER): Payer: Medicare Other | Admitting: Family Medicine

## 2013-04-30 VITALS — BP 124/80 | HR 84 | Temp 98.5°F | Ht 60.25 in | Wt 110.0 lb

## 2013-04-30 DIAGNOSIS — Z85038 Personal history of other malignant neoplasm of large intestine: Secondary | ICD-10-CM

## 2013-04-30 DIAGNOSIS — K5289 Other specified noninfective gastroenteritis and colitis: Secondary | ICD-10-CM

## 2013-04-30 DIAGNOSIS — E7800 Pure hypercholesterolemia, unspecified: Secondary | ICD-10-CM

## 2013-04-30 DIAGNOSIS — K529 Noninfective gastroenteritis and colitis, unspecified: Secondary | ICD-10-CM | POA: Insufficient documentation

## 2013-04-30 DIAGNOSIS — Z23 Encounter for immunization: Secondary | ICD-10-CM

## 2013-04-30 LAB — LIPID PANEL
LDL Cholesterol: 93 mg/dL (ref 0–99)
Total CHOL/HDL Ratio: 3

## 2013-04-30 NOTE — Patient Instructions (Addendum)
Flu vaccine and pneumonia vaccine today  You are probably due for a tetanus shot-since medicare does not cover those - I would go to the health dept for that  Cholesterol testing today Get your mammogram and gyn exam next month and keep up with bone density testing  Avoid red meat/ fried foods/ egg yolks/ fatty breakfast meats/ butter, cheese and high fat dairy/ and shellfish  For cholesterol  Let us know if GI symptoms reoccur

## 2013-04-30 NOTE — Progress Notes (Signed)
Subjective:    Patient ID: Lori Peters, female    DOB: 1946-05-26, 67 y.o.   MRN: [id number]  HPI Here for hospital f/u and to re establish  Has not been here in a while   hosp from 8/16-8/18 at [facility] for enterocolitis tx with abx  Incidentally also found a liver hemangioma (verified with [place])  She was d/c with cipro and flagyl  She finished on tues  Feels a lot better overall   Did eat some pecans a few days before - ? Diverticulitis  She is cutting back on nuts and seeds  Does not eat a lot of popcorn     She sees Dr Doylene Canning for oncol- past hx of colon cancer- had a good visit on June the 3rd She will see him yearly  Last colonosc was 9/13 with 3 year recall     From hosp BC neg ua neg cxr neg Hb 11.3 Wbc normalized to 6.9 by the end of the visit    Had a shingles vaccine June the 3rd  mammo appt is in sept along with her gyn exam  Flu shot last fall   Patient Active Problem List   Diagnosis Date Noted  . Enterocolitis 04/30/2013  . GERD 04/17/2008  . History of shingles 04/03/2007  . HYPERCHOLESTEROLEMIA 04/03/2007  . OSTEOARTHRITIS 04/03/2007  . OSTEOPENIA 04/03/2007  . COLON CANCER, HX OF 04/03/2007  . DVT, HX OF 04/03/2007   History reviewed. No pertinent past medical history. History reviewed. No pertinent past surgical history. History  Substance Use Topics  . Smoking status: Never Smoker   . Smokeless tobacco: Not on file  . Alcohol Use: No   No family history on file. Allergies  Allergen Reactions  . Garlic     REACTION: Rash   No current outpatient prescriptions on file prior to visit.   No current facility-administered medications on file prior to visit.    Review of Systems Review of Systems  Constitutional: Negative for fever, appetite change, fatigue and unexpected weight change.  Eyes: Negative for pain and visual disturbance.  Respiratory: Negative for cough and shortness of breath.   Cardiovascular: Negative for cp or  palpitations    Gastrointestinal: Negative for nausea, diarrhea and constipation. (no longer has abd pain) Genitourinary: Negative for urgency and frequency.  Skin: Negative for pallor or rash   Neurological: Negative for weakness, light-headedness, numbness and headaches.  Hematological: Negative for adenopathy. Does not bruise/bleed easily.  Psychiatric/Behavioral: Negative for dysphoric mood. The patient is not nervous/anxious.         Objective:   Physical Exam  Constitutional: She appears well-developed and well-nourished. No distress.  HENT:  Head: Normocephalic and atraumatic.  Right Ear: External ear normal.  Left Ear: External ear normal.  Nose: Nose normal.  Mouth/Throat: Oropharynx is clear and moist.  Eyes: Conjunctivae and EOM are normal. Pupils are equal, round, and reactive to light. Right eye exhibits no discharge. Left eye exhibits no discharge. No scleral icterus.  Neck: Normal range of motion. Neck supple. No JVD present. Carotid bruit is not present. No thyromegaly present.  Cardiovascular: Normal rate, regular rhythm, normal heart sounds and intact distal pulses.  Exam reveals no gallop.   Pulmonary/Chest: Effort normal and breath sounds normal. No respiratory distress. She has no wheezes.  Abdominal: Soft. Bowel sounds are normal. She exhibits no distension, no abdominal bruit and no mass. There is no tenderness. There is no rebound and no guarding.  Musculoskeletal: She exhibits no  edema.  Lymphadenopathy:    She has no cervical adenopathy.  Neurological: She is alert. She has normal reflexes. No cranial nerve deficit. She exhibits normal muscle tone. Coordination normal.  Skin: Skin is warm and dry. No rash noted. No erythema. No pallor.  Psychiatric: She has a normal mood and affect.          Assessment & Plan:

## 2013-05-01 ENCOUNTER — Encounter: Payer: Self-pay | Admitting: *Deleted

## 2013-05-01 NOTE — Assessment & Plan Note (Signed)
Lipids today Was lost to f/u for a while  Disc low sat fat diet

## 2013-05-01 NOTE — Assessment & Plan Note (Signed)
This appears to be clinically resolved s/p hosp and tx with cipro and flagyl  ? If due to virus or diverticulitis No bowel obst  Doing much better now  Plans to avoid nuts/ seeds and popcorn Update if symptoms return hosp records and tests reviewed in detail

## 2013-05-01 NOTE — Assessment & Plan Note (Signed)
Pt will continue f/u with oncology Will send hosp records and note to her oncologist  Liver hemangioma noted in hospital

## 2013-05-30 ENCOUNTER — Other Ambulatory Visit: Payer: Self-pay | Admitting: Obstetrics and Gynecology

## 2013-05-30 DIAGNOSIS — R928 Other abnormal and inconclusive findings on diagnostic imaging of breast: Secondary | ICD-10-CM

## 2013-06-10 ENCOUNTER — Ambulatory Visit
Admission: RE | Admit: 2013-06-10 | Discharge: 2013-06-10 | Disposition: A | Discharge status: Home / Self Care | Payer: Medicare Other | Source: Ambulatory Visit | Attending: Obstetrics and Gynecology | Admitting: Obstetrics and Gynecology

## 2013-06-10 DIAGNOSIS — R928 Other abnormal and inconclusive findings on diagnostic imaging of breast: Secondary | ICD-10-CM

## 2014-01-02 ENCOUNTER — Ambulatory Visit (INDEPENDENT_AMBULATORY_CARE_PROVIDER_SITE_OTHER): Payer: Commercial Managed Care - HMO | Admitting: Family Medicine

## 2014-01-02 ENCOUNTER — Encounter: Payer: Self-pay | Admitting: Family Medicine

## 2014-01-02 VITALS — BP 114/73 | HR 92 | Temp 98.9°F | Ht 60.25 in | Wt 113.5 lb

## 2014-01-02 DIAGNOSIS — J069 Acute upper respiratory infection, unspecified: Secondary | ICD-10-CM | POA: Insufficient documentation

## 2014-01-02 DIAGNOSIS — B9789 Other viral agents as the cause of diseases classified elsewhere: Principal | ICD-10-CM

## 2014-01-02 MED ORDER — GUAIFENESIN-CODEINE 100-10 MG/5ML PO SYRP: 5.0000 mL | ORAL_SOLUTION | Freq: Every evening | ORAL | Status: DC | PRN

## 2014-01-02 NOTE — Progress Notes (Signed)
Pre visit review using our clinic review tool, if applicable. No additional management support is needed unless otherwise documented below in the visit note. 

## 2014-01-02 NOTE — Progress Notes (Signed)
   Subjective:    Patient ID: Lori Peters, female    DOB: 1946/08/11, 68 y.o.   MRN: [id number]  Cough This is a new problem. The current episode started in the past 7 days (2-3 days). The problem has been gradually worsening. The problem occurs constantly (kee(ping her up at night). The cough is non-productive. Associated symptoms include headaches, nasal congestion, postnasal drip, rhinorrhea and a sore throat. Pertinent negatives include no chills, ear pain, fever, myalgias, shortness of breath or wheezing. Associated symptoms comments: No sinus pain. The symptoms are aggravated by lying down. Risk factors: non smoker. She has tried OTC cough suppressant for the symptoms. The treatment provided mild relief. There is no history of asthma, bronchiectasis, bronchitis, COPD, emphysema, environmental allergies or pneumonia.      Review of Systems  Constitutional: Negative for fever and chills.  HENT: Positive for postnasal drip, rhinorrhea and sore throat. Negative for ear pain.   Respiratory: Positive for cough. Negative for shortness of breath and wheezing.   Musculoskeletal: Negative for myalgias.  Allergic/Immunologic: Negative for environmental allergies.  Neurological: Positive for headaches.       Objective:   Physical Exam  Constitutional: Vital signs are normal. She appears well-developed and well-nourished. She is cooperative.  Non-toxic appearance. She does not appear ill. No distress.  HENT:  Head: Normocephalic.  Right Ear: Hearing, tympanic membrane, external ear and ear canal normal. Tympanic membrane is not erythematous, not retracted and not bulging.  Left Ear: Hearing, tympanic membrane, external ear and ear canal normal. Tympanic membrane is not erythematous, not retracted and not bulging.  Nose: Mucosal edema and rhinorrhea present. Right sinus exhibits no maxillary sinus tenderness and no frontal sinus tenderness. Left sinus exhibits no maxillary sinus tenderness and  no frontal sinus tenderness.  Mouth/Throat: Uvula is midline, oropharynx is clear and moist and mucous membranes are normal.  Eyes: Conjunctivae, EOM and lids are normal. Pupils are equal, round, and reactive to light. Lids are everted and swept, no foreign bodies found.  Neck: Trachea normal and normal range of motion. Neck supple. Carotid bruit is not present. No mass and no thyromegaly present.  Cardiovascular: Normal rate, regular rhythm, S1 normal, S2 normal, normal heart sounds, intact distal pulses and normal pulses.  Exam reveals no gallop and no friction rub.   No murmur heard. Pulmonary/Chest: Effort normal and breath sounds normal. Not tachypneic. No respiratory distress. She has no decreased breath sounds. She has no wheezes. She has no rhonchi. She has no rales.  Neurological: She is alert.  Skin: Skin is warm, dry and intact. No rash noted.  Psychiatric: Her speech is normal and behavior is normal. Judgment normal. Her mood appears not anxious. Cognition and memory are normal. She does not exhibit a depressed mood.          Assessment & Plan:

## 2014-01-02 NOTE — Assessment & Plan Note (Signed)
Symptoma care.

## 2014-01-02 NOTE — Patient Instructions (Signed)
Mucinex Dm during the day, cough suppressant prescription at night. Rest, fluids.  Expect 7-10 days at least of illness, but call sooner if shortness of breath or symptoms not gradually improving after another 4-5 day.

## 2014-01-27 ENCOUNTER — Ambulatory Visit: Payer: Self-pay | Admitting: Oncology

## 2014-01-27 LAB — COMPREHENSIVE METABOLIC PANEL
ALT: 20 U/L (ref 12–78)
Albumin: 3.8 g/dL (ref 3.4–5.0)
Alkaline Phosphatase: 51 U/L
Anion Gap: 8 (ref 7–16)
BUN: 14 mg/dL (ref 7–18)
Bilirubin,Total: 0.4 mg/dL (ref 0.2–1.0)
Calcium, Total: 8.9 mg/dL (ref 8.5–10.1)
Chloride: 104 mmol/L (ref 98–107)
Co2: 30 mmol/L (ref 21–32)
Creatinine: 0.57 mg/dL — ABNORMAL LOW (ref 0.60–1.30)
EGFR (Non-African Amer.): >60
Glucose: 80 mg/dL (ref 65–99)
Osmolality: 283 (ref 275–301)
Potassium: 4.2 mmol/L (ref 3.5–5.1)
SGOT(AST): 17 U/L (ref 15–37)
Sodium: 142 mmol/L (ref 136–145)
Total Protein: 7.2 g/dL (ref 6.4–8.2)

## 2014-01-27 LAB — CBC CANCER CENTER
BASOS ABS: 0 x10 3/mm (ref 0.0–0.1)
BASOS PCT: 0.6 %
Eosinophil #: 0 x10 3/mm (ref 0.0–0.7)
Eosinophil %: 0.8 %
HCT: 39.1 % (ref 35.0–47.0)
HGB: 12.8 g/dL (ref 12.0–16.0)
LYMPHS ABS: 2.8 x10 3/mm (ref 1.0–3.6)
LYMPHS PCT: 44.4 %
MCH: 29.5 pg (ref 26.0–34.0)
MCHC: 32.8 g/dL (ref 32.0–36.0)
MCV: 90 fL (ref 80–100)
MONOS PCT: 8.6 %
Monocyte #: 0.5 x10 3/mm (ref 0.2–0.9)
Neutrophil #: 2.9 x10 3/mm (ref 1.4–6.5)
Neutrophil %: 45.6 %
Platelet: 219 x10 3/mm (ref 150–440)
RBC: 4.34 10*6/uL (ref 3.80–5.20)
RDW: 13.5 % (ref 11.5–14.5)
WBC: 6.3 x10 3/mm (ref 3.6–11.0)

## 2014-01-28 LAB — CEA: CEA: 1.3 ng/mL (ref 0.0–4.7)

## 2014-02-02 ENCOUNTER — Ambulatory Visit: Payer: Self-pay | Admitting: Oncology

## 2014-04-16 ENCOUNTER — Ambulatory Visit: Payer: Self-pay | Admitting: Oncology

## 2014-05-05 ENCOUNTER — Ambulatory Visit: Payer: Self-pay | Admitting: Oncology

## 2014-06-16 ENCOUNTER — Ambulatory Visit (INDEPENDENT_AMBULATORY_CARE_PROVIDER_SITE_OTHER): Payer: Commercial Managed Care - HMO

## 2014-06-16 DIAGNOSIS — Z23 Encounter for immunization: Secondary | ICD-10-CM

## 2014-06-17 ENCOUNTER — Ambulatory Visit: Payer: Commercial Managed Care - HMO

## 2014-12-25 NOTE — H&P (Signed)
PATIENT NAME:  Lori Peters, Lori Peters MR#:  [id number] DATE OF BIRTH:  [birth year]  DATE OF ADMISSION:  04/19/2013  REFERRING PHYSICIAN:  Marta Antu, M.D.    PRIMARY CARE PHYSICIAN: Dr. Alba Cory at [place]: Abdominal pain, nausea and vomiting.   HISTORY OF PRESENT ILLNESS: This is a 69 year old female with significant past medical history of osteoporosis, hyperlipidemia and a history of colon cancer in the past, status post treatment currently in remission, presents with above-mentioned complaints. Reports her symptoms started with abdominal pain earlier today which was intermittent, but then reports this evening, she developed multiple episodes of nausea and vomiting. Denies any hemoptysis or coffee-ground emesis, reports it is just what she ate. She had multiple episodes. Denies any fever or chills. Upon presentation to the ED, the patient had significant leukocytosis at 20,000, was afebrile. She had CT abdomen and pelvis with IV contrast showing evidence of enterocolitis.  The patient denies any diarrhea. There was an incidental finding of possible hypodensities in the liver. The patient received IV Cipro and Flagyl in ED, and hospitalist service was requested to admit the patient for further management and work-up.   PAST MEDICAL HISTORY: 1.  Osteoporosis.  2.  Hyperlipidemia.  3.  History of colon cancer.  4.  History of deep vein thrombosis.   SURGICAL HISTORY:  1.  History of Port-A-Cath.  2.  Laparotomy-assisted sigmoid colectomy.  4.  Exploratory laparotomy with Hartmann procedure.  4.  Drainage of the abscess, revision of colostomy and closure of dehiscence, 2007.   ALLERGIES: No known drug allergies.   HOME MEDICATIONS:  Fosamax weekly.   SOCIAL HISTORY: No history of smoking, illicit drug or alcohol use.   FAMILY HISTORY: Significant for cancer in the family. As well, grandparents had cardiac disease, but they died in their 37s.    REVIEW OF SYSTEMS:   CONSTITUTIONAL: Denies fever, chills. Complains of weakness, fatigue. Denies weight gain, weight loss.  EYES: Denies blurry vision, double vision, inflammation, glaucoma.  ENT: Denies tinnitus, ear pain, epistaxis or discharge.  RESPIRATORY: Denies cough, wheezing, hemoptysis, COPD,  painful respiratory.  CARDIOVASCULAR: Denies chest pain, palpitations, syncope, edema, arrhythmia.  GASTROINTESTINAL: Complains of nausea, vomiting, abdominal pain. Denies diarrhea, hematemesis, melena, rectal bleed, jaundice.  GENITOURINARY: Denies dysuria, hematuria, renal colic.  ENDOCRINE: Denies polyuria, polydipsia, heat or cold intolerance.  HEMATOLOGY: Denies anemia, easy bruising. Has history of DVT in the past.  INTEGUMENTARY: Denies acne, rash or skin lesions.  MUSCULOSKELETAL: Denies any gout, cramps. Has history of arthritis.  NEUROLOGIC: Denies CVA, TIA, ataxia, dementia.  PSYCHIATRIC: Denies anxiety, insomnia, schizophrenia, substance or alcohol abuse.   PHYSICAL EXAMINATION: VITAL SIGNS: Temperature 98.4, pulse 89, respiratory rate 18, blood pressure 98/61, saturating at 96% on room air.  GENERAL: Well-nourished female who is comfortable and in no apparent distress.  HEENT: Head atraumatic, normocephalic. Pupils are equal and reactive to light. Pink conjunctivae. Anicteric sclerae. Moist oral mucosa.  NECK: Supple. No thyromegaly. No JVD.  CHEST: Good air entry bilaterally. No wheezing, rales or rhonchi.  CARDIOVASCULAR: S1, S2 heard. No rubs, murmurs or gallops.  ABDOMEN: Has mild tenderness in abdominal area, but no rebound, no guarding. Bowel sounds present. No organomegaly could be appreciated.  EXTREMITIES: No edema. No clubbing. No cyanosis. Good pedal pulses bilaterally. Good capillary refill.  PSYCHIATRIC: Awake, alert x 3. Intact judgment and insight.  SKIN: Normal skin turgor. Warm and dry.   NEUROLOGIC: Cranial nerves grossly intact. Motor 5 out of 5. No focal  deficits.   LYMPHATICS: No cervical lymphadenopathy.   PERTINENT LABS: Glucose 198, BUN 14, creatinine 0.83, sodium 136, potassium 3.6, chloride 102, CO2 25, ALT 24, AST 26, alk phos 58, white blood cells 20.4, hemoglobin 16.3, hematocrit 48.9, platelets 304. Urinalysis negative for leukocyte esterase and nitrites.   IMAGING: CT abdomen and pelvis with IV contrast showing no evidence of pelvic mass, free fluid or any opacity. Inguinal regions are normal, showing enterocolitis with no evidence of obstruction and there are multiple liver hypodensities that is found to be further characterized on the current examination.    ASSESSMENT AND PLAN: 1.  Systemic inflammatory response syndrome. This is due to enterocolitis as the patient has significant leukocytosis. As well, she was tachycardic at 116 on the EKG. At one  point, she was hypotensive with systolic blood pressure less than 100.  2.  Enterocolitis. We will send stool workup. We will check white blood cells and cultures. She has no diarrhea, so will not check Clostridium difficile, as well as start her on aggressive IV fluid hydration. Will have her on Cipro and Flagyl as well.  3.  Abnormal findings in the liver on the CT abdomen and pelvis. We will check right upper quadrant ultrasound for further evaluation, especially with her known history of colon cancer in the past.  4.  History of osteoporosis, the patient can be resumed back on the Fosamax when she is more stable.  5.  Deep vein thrombosis prophylaxis. Subcutaneous heparin.  6.  CODE STATUS: Full code.  Total time spent on admission and patient care was 50 minutes.    ____________________________ Albertine Patricia, MD dse:nts D: 04/19/2013 04:20:00 ET T: 04/19/2013 04:42:52 ET JOB#: [id number]  cc: Albertine Patricia, MD, <Dictator> Rashika Bettes Graciela Husbands MD ELECTRONICALLY SIGNED 04/26/2013 12:10

## 2014-12-25 NOTE — Discharge Summary (Signed)
PATIENT NAME:  Lori, Peters MR#:  [id number] DATE OF BIRTH:  [birth year]  DATE OF ADMISSION:  04/19/2013 DATE OF DISCHARGE:  04/21/2013  ADMITTING PHYSICIAN:  Dr. Waldron Labs.  DISCHARGING PHYSICIAN:  Dr. Gladstone Lighter.  PRIMARY CARE MD:  Dr. Loura Pardon at [facility].    DISCHARGE DIAGNOSES 1.  Systemic inflammatory response syndrome. 2.  Enterocolitis.  3.  Osteoporosis.  4.  History of colon cancer.  5.  Diverticulosis.   DISCHARGE MEDICATIONS 1.  Fosamax 70 mg p.o. once a week.  2.  Flagyl 500 mg p.o. q.8 hours for 9 more days.  3.  Ciprofloxacin 500 mg p.o. b.i.d. for 8 more days.   DISCHARGE DIET: Regular diet.   DISCHARGE ACTIVITY: As tolerated.    FOLLOWUP INSTRUCTIONS:  PCP followup in 1 to 2 weeks.   LABS AND IMAGING STUDIES 1.  WBC 6.9, hemoglobin 11.3, hematocrit 32.7, platelet count 168.  2.  Sodium 143, potassium 3.9, chloride 114, bicarb 27, BUN 4, creatinine 0.59, glucose 106 and calcium of 7.2.  3.  Ultrasound abdomen showing prominence of the common bile duct and pancreatic, duct status post cholecystectomy changes.  The hyperechoic hepatic mass is nonspecific and represents a hemangioma.   4.  Blood cultures remain negative.  5.  Urinalysis negative for any infection.  6.  Chest x-ray showed no acute cardiopulmonary disease.  7.  CT of the abdomen and pelvis done on admission showing changes of enterocolitis without obstruction, hyperenhancing lesion in the right lobe of the liver could be a hemangioma. Nodularity of left adrenal gland is seen.  8.  Lipase on admission was 139.   BRIEF HOSPITAL COURSE: [name] is a very pleasant 69 year old Caucasian female with past medical history significant for osteoporosis, remote history of colon cancer, currently in remission. Presents to the hospital secondary to left lower quadrant abdominal pain, nausea and vomiting, and was also noted to have significant elevation of white count of 20,000. CT of the  abdomen showing enterocolitis.  1.  Systemic inflammatory response syndrome secondary to significant enterocolitis as noted on CT scan. The patient was admitted for IV fluids, has been n.p.o. and diet was slowly advanced as her nausea improved. She was started on Cipro and Flagyl for enterocolitis. She did not have any diarrhea to order for stool studies on admission. She had significant improvement in the first 24 to 48 hours and was able to tolerate regular diet prior to discharge: Antibiotics were changed over to p.o. Cipro and Flagyl prior to discharge.  2.  Liver lesion noted on CT scan, possibly hemangioma. Ultrasound was done to confirm that. The patient was concerned about recurrence of colon cancer. The patient follows with Dr. Oliva Bustard as an outpatient and recent blood work regarding colon cancer about 2 months ago was completely normal. Advised to follow up further.   3. Osteoporosis. Fosamax was continued at the time of discharge. Her course has been otherwise  uneventful in the hospital.   DISCHARGE CONDITION: Stable.   DISCHARGE DISPOSITION: Home.   TIME SPENT ON DISCHARGE: 45 minutes.   ____________________________ Gladstone Lighter, MD rk:cs D: 04/23/2013 15:22:00 ET T: 04/23/2013 20:20:49 ET JOB#: [id number]  cc: Gladstone Lighter, MD, <Dictator> Marne A. Glori Bickers, MD Gladstone Lighter MD ELECTRONICALLY SIGNED 04/28/2013 22:15

## 2015-02-25 ENCOUNTER — Ambulatory Visit (INDEPENDENT_AMBULATORY_CARE_PROVIDER_SITE_OTHER): Payer: PPO | Admitting: Primary Care

## 2015-02-25 ENCOUNTER — Encounter: Payer: Self-pay | Admitting: Primary Care

## 2015-02-25 VITALS — BP 114/78 | HR 66 | Temp 98.2°F | Ht 60.25 in | Wt 113.1 lb

## 2015-02-25 DIAGNOSIS — K219 Gastro-esophageal reflux disease without esophagitis: Secondary | ICD-10-CM | POA: Diagnosis not present

## 2015-02-25 MED ORDER — OMEPRAZOLE 20 MG PO CPDR: 20.0000 mg | DELAYED_RELEASE_CAPSULE | Freq: Every day | ORAL | Status: DC

## 2015-02-25 NOTE — Progress Notes (Signed)
Pre visit review using our clinic review tool, if applicable. No additional management support is needed unless otherwise documented below in the visit note. 

## 2015-02-25 NOTE — Progress Notes (Signed)
   Subjective:    Patient ID: Lori Peters, female    DOB: 08/29/1946, 69 y.o.   MRN: [id number]  HPI  Lori Peters is a 69 year old female who presents today with a chief complaint of GERD symptoms. She reports symptoms of acid reflux including scratchy throat, reflux of gastric contents, and burning to her esophagus over the past 10 days. She's been taking the Zantac 150mg  twice daily for one week which has provided some relief but she doesn't feel fully controlled. She's had a history of the same in the past which improved with Zantac. She is going out of town soon and would like resolve of symptoms. Denies fevers, abdominal pain, vomiting.   Review of Systems  Constitutional: Negative for fever and chills.  Respiratory: Negative for cough and shortness of breath.   Cardiovascular: Negative for chest pain.  Gastrointestinal: Negative for nausea, vomiting and abdominal pain.  Neurological: Negative for weakness.       No past medical history on file.  History   Social History  . Marital Status: Married    Spouse Name: N/A  . Number of Children: N/A  . Years of Education: N/A   Occupational History  . Not on file.   Social History Main Topics  . Smoking status: Never Smoker   . Smokeless tobacco: Never Used  . Alcohol Use: No  . Drug Use: No  . Sexual Activity: Not on file   Other Topics Concern  . Not on file   Social History Narrative    No past surgical history on file.  No family history on file.  Allergies  Allergen Reactions  . Garlic     REACTION: Rash    Current Outpatient Prescriptions on File Prior to Visit  Medication Sig Dispense Refill  . calcium carbonate 200 MG capsule Take 250 mg by mouth 2 (two) times daily with a meal.    . alendronate (FOSAMAX) 70 MG tablet Take 70 mg by mouth every 7 (seven) days. Take with a full glass of water on an empty stomach.     No current facility-administered medications on file prior to visit.    BP 114/78 mmHg   Pulse 66  Temp(Src) 98.2 F (36.8 C) (Oral)  Ht 5' 0.25" (1.53 m)  Wt 113 lb 1.9 oz (51.311 kg)  BMI 21.92 kg/m2  SpO2 98%    Objective:   Physical Exam  Constitutional: She appears well-nourished.  Cardiovascular: Normal rate and regular rhythm.   Pulmonary/Chest: Effort normal and breath sounds normal.  Abdominal: Soft. Bowel sounds are normal. There is no tenderness.  Skin: Skin is warm and dry.          Assessment & Plan:

## 2015-02-25 NOTE — Assessment & Plan Note (Signed)
Reflux of gastric contents, burning. Some resolve but not complete control on Zantac 150 mg BID. RX for omeprazole 20 mg daily. Education provided regarding triggers. Follow up in 4 weeks for re-evaluation.

## 2015-02-25 NOTE — Patient Instructions (Addendum)
Stop taking Zantac. Start Omeprazole 20mg  tablets. Take 1 tablet by mouth daily for reflux symptoms.  Follow up in 4 weeks for re-evaluation of reflux.  Food Choices for Gastroesophageal Reflux Disease When you have gastroesophageal reflux disease (GERD), the foods you eat and your eating habits are very important. Choosing the right foods can help ease the discomfort of GERD. WHAT GENERAL GUIDELINES DO I NEED TO FOLLOW?  Choose fruits, vegetables, whole grains, low-fat dairy products, and low-fat meat, fish, and poultry.  Limit fats such as oils, salad dressings, butter, nuts, and avocado.  Keep a food diary to identify foods that cause symptoms.  Avoid foods that cause reflux. These may be different for different people.  Eat frequent small meals instead of three large meals each day.  Eat your meals slowly, in a relaxed setting.  Limit fried foods.  Cook foods using methods other than frying.  Avoid drinking alcohol.  Avoid drinking large amounts of liquids with your meals.  Avoid bending over or lying down until 2-3 hours after eating. WHAT FOODS ARE NOT RECOMMENDED? The following are some foods and drinks that may worsen your symptoms: Vegetables Tomatoes. Tomato juice. Tomato and spaghetti sauce. Chili peppers. Onion and garlic. Horseradish. Fruits Oranges, grapefruit, and lemon (fruit and juice). Meats High-fat meats, fish, and poultry. This includes hot dogs, ribs, ham, sausage, salami, and bacon. Dairy Whole milk and chocolate milk. Sour cream. Cream. Butter. Ice cream. Cream cheese.  Beverages Coffee and tea, with or without caffeine. Carbonated beverages or energy drinks. Condiments Hot sauce. Barbecue sauce.  Sweets/Desserts Chocolate and cocoa. Donuts. Peppermint and spearmint. Fats and Oils High-fat foods, including [place] fries and potato chips. Other Vinegar. Strong spices, such as black pepper, white pepper, red pepper, cayenne, curry powder, cloves,  ginger, and chili powder. The items listed above may not be a complete list of foods and beverages to avoid. Contact your dietitian for more information. Document Released: 08/21/2005 Document Revised: 08/26/2013 Document Reviewed: 06/25/2013 [facility] Patient Information 2015 [place], [place]. This information is not intended to replace advice given to you by your health care provider. Make sure you discuss any questions you have with your health care provider.

## 2015-03-12 ENCOUNTER — Ambulatory Visit (INDEPENDENT_AMBULATORY_CARE_PROVIDER_SITE_OTHER): Payer: PPO | Admitting: Nurse Practitioner

## 2015-03-12 ENCOUNTER — Ambulatory Visit (HOSPITAL_COMMUNITY): Payer: PPO

## 2015-03-12 ENCOUNTER — Ambulatory Visit (INDEPENDENT_AMBULATORY_CARE_PROVIDER_SITE_OTHER)
Admission: RE | Admit: 2015-03-12 | Discharge: 2015-03-12 | Disposition: A | Discharge status: Home / Self Care | Payer: PPO | Source: Ambulatory Visit | Attending: Nurse Practitioner | Admitting: Nurse Practitioner

## 2015-03-12 VITALS — BP 138/90 | HR 80 | Temp 98.3°F | Resp 14 | Ht 60.25 in | Wt 112.0 lb

## 2015-03-12 DIAGNOSIS — M546 Pain in thoracic spine: Secondary | ICD-10-CM

## 2015-03-12 MED ORDER — CYCLOBENZAPRINE HCL 5 MG PO TABS: 5.0000 mg | ORAL_TABLET | Freq: Every day | ORAL | Status: DC

## 2015-03-12 NOTE — Progress Notes (Signed)
Pre visit review using our clinic review tool, if applicable. No additional management support is needed unless otherwise documented below in the visit note. 

## 2015-03-12 NOTE — Patient Instructions (Signed)
X-ray at [facility] at night time (can take 1 during the day if not leaving the house).   Ibuprofen or tylenol for pain  We will call you with results.   Follow up in 1 week if no improvement and normal x-ray.   You DO NOT have to follow up if feeling improved and normal x-ray.

## 2015-03-12 NOTE — Progress Notes (Signed)
   Subjective:    Patient ID: Lori Peters, female    DOB: December 07, 1945, 69 y.o.   MRN: [id number]  HPI  Lori Peters is a 69 yo female with a CC of pain in middle of back x 4-5 days.   1) Back from beach Monday/Tuesday middle of the back, no trauma, unsure of when it started, worsening happened once a few months ago. Left and middle of back  Laying fine,  Worse, standing/sitting in a car  Ice, Heat Tylenol- not helpful   Review of Systems  Constitutional: Negative for fever, chills, diaphoresis and fatigue.  Respiratory: Negative for chest tightness, shortness of breath and wheezing.   Cardiovascular: Negative for chest pain, palpitations and leg swelling.  Gastrointestinal: Negative for nausea, vomiting and diarrhea.  Musculoskeletal: Positive for back pain. Negative for neck pain.  Skin: Negative for rash.  Neurological: Negative for dizziness, weakness, numbness and headaches.  Psychiatric/Behavioral: The patient is not nervous/anxious.       Objective:   Physical Exam  Constitutional: She is oriented to person, place, and time. She appears well-developed and well-nourished. No distress.  BP 138/90 mmHg  Pulse 80  Temp(Src) 98.3 F (36.8 C)  Resp 14  Ht 5' 0.25" (1.53 m)  Wt 112 lb (50.803 kg)  BMI 21.70 kg/m2  SpO2 95%   HENT:  Head: Normocephalic and atraumatic.  Right Ear: External ear normal.  Left Ear: External ear normal.  Cardiovascular: Normal rate, regular rhythm and normal heart sounds.  Exam reveals no gallop and no friction rub.   No murmur heard. Pulmonary/Chest: Effort normal and breath sounds normal. No respiratory distress. She has no wheezes. She has no rales. She exhibits no tenderness.  Neurological: She is alert and oriented to person, place, and time. No cranial nerve deficit. She exhibits normal muscle tone. Coordination normal.  Iliopsoas 5/5 Bilateral, Tib anterior 5/5 bilateral, EHL 5/5 bilateral, no ankle clonus, intact heel/toe/sequential  walking, sensation intact upper and lower extremities. Straight leg raise negative bilaterally.   Skin: Skin is warm and dry. No rash noted. She is not diaphoretic.  Psychiatric: She has a normal mood and affect. Her behavior is normal. Judgment and thought content normal.          Assessment & Plan:

## 2015-03-25 ENCOUNTER — Encounter: Payer: Self-pay | Admitting: Nurse Practitioner

## 2015-03-25 DIAGNOSIS — M546 Pain in thoracic spine: Secondary | ICD-10-CM | POA: Insufficient documentation

## 2015-03-25 NOTE — Assessment & Plan Note (Signed)
Worsening. Will obtain X-ray at [facility] of T-spine. Flexeril at night, ibuprofen/tylenol okay for pain Follow up in 1 week if no improvement and normal x-ray.  DO NOT have to follow up if feeling improved and normal x-ray.

## 2015-03-26 ENCOUNTER — Ambulatory Visit: Payer: PPO | Admitting: Family Medicine

## 2015-04-14 ENCOUNTER — Other Ambulatory Visit: Payer: Self-pay | Admitting: *Deleted

## 2015-04-14 ENCOUNTER — Inpatient Hospital Stay: Payer: PPO | Attending: Oncology

## 2015-04-14 DIAGNOSIS — R5383 Other fatigue: Secondary | ICD-10-CM | POA: Insufficient documentation

## 2015-04-14 DIAGNOSIS — C189 Malignant neoplasm of colon, unspecified: Secondary | ICD-10-CM

## 2015-04-14 DIAGNOSIS — R531 Weakness: Secondary | ICD-10-CM | POA: Insufficient documentation

## 2015-04-14 DIAGNOSIS — Z85038 Personal history of other malignant neoplasm of large intestine: Secondary | ICD-10-CM | POA: Insufficient documentation

## 2015-04-14 DIAGNOSIS — Z79899 Other long term (current) drug therapy: Secondary | ICD-10-CM | POA: Insufficient documentation

## 2015-04-14 DIAGNOSIS — Z801 Family history of malignant neoplasm of trachea, bronchus and lung: Secondary | ICD-10-CM | POA: Diagnosis not present

## 2015-04-14 DIAGNOSIS — Z86718 Personal history of other venous thrombosis and embolism: Secondary | ICD-10-CM | POA: Insufficient documentation

## 2015-04-14 LAB — CBC WITH DIFFERENTIAL/PLATELET
Basophils Absolute: 0 10*3/uL (ref 0–0.1)
Basophils Relative: 1 %
Eosinophils Absolute: 0 10*3/uL (ref 0–0.7)
Eosinophils Relative: 1 %
HEMATOCRIT: 38.1 % (ref 35.0–47.0)
Hemoglobin: 12.8 g/dL (ref 12.0–16.0)
LYMPHS ABS: 2.9 10*3/uL (ref 1.0–3.6)
LYMPHS PCT: 44 %
MCH: 29.1 pg (ref 26.0–34.0)
MCHC: 33.6 g/dL (ref 32.0–36.0)
MCV: 86.7 fL (ref 80.0–100.0)
MONO ABS: 0.5 10*3/uL (ref 0.2–0.9)
MONOS PCT: 7 %
NEUTROS ABS: 3.2 10*3/uL (ref 1.4–6.5)
Neutrophils Relative %: 47 %
PLATELETS: 217 10*3/uL (ref 150–440)
RBC: 4.39 MIL/uL (ref 3.80–5.20)
RDW: 13.6 % (ref 11.5–14.5)
WBC: 6.7 10*3/uL (ref 3.6–11.0)

## 2015-04-14 LAB — COMPREHENSIVE METABOLIC PANEL
ALK PHOS: 44 U/L (ref 38–126)
ALT: 29 U/L (ref 14–54)
AST: 34 U/L (ref 15–41)
Albumin: 4.3 g/dL (ref 3.5–5.0)
Anion gap: 5 (ref 5–15)
BILIRUBIN TOTAL: 0.5 mg/dL (ref 0.3–1.2)
BUN: 13 mg/dL (ref 6–20)
CO2: 26 mmol/L (ref 22–32)
Calcium: 8.8 mg/dL — ABNORMAL LOW (ref 8.9–10.3)
Chloride: 105 mmol/L (ref 101–111)
Creatinine, Ser: 0.56 mg/dL (ref 0.44–1.00)
GLUCOSE: 96 mg/dL (ref 65–99)
POTASSIUM: 4 mmol/L (ref 3.5–5.1)
SODIUM: 136 mmol/L (ref 135–145)
TOTAL PROTEIN: 7.3 g/dL (ref 6.5–8.1)

## 2015-04-15 ENCOUNTER — Other Ambulatory Visit: Payer: Commercial Managed Care - HMO

## 2015-04-15 LAB — CEA: CEA: 1 ng/mL (ref 0.0–4.7)

## 2015-04-22 ENCOUNTER — Inpatient Hospital Stay (HOSPITAL_BASED_OUTPATIENT_CLINIC_OR_DEPARTMENT_OTHER): Payer: PPO | Admitting: Oncology

## 2015-04-22 VITALS — BP 150/86 | HR 76 | Temp 96.3°F | Resp 18 | Wt 114.1 lb

## 2015-04-22 DIAGNOSIS — R5383 Other fatigue: Secondary | ICD-10-CM | POA: Diagnosis not present

## 2015-04-22 DIAGNOSIS — Z79899 Other long term (current) drug therapy: Secondary | ICD-10-CM

## 2015-04-22 DIAGNOSIS — Z85038 Personal history of other malignant neoplasm of large intestine: Secondary | ICD-10-CM

## 2015-04-22 DIAGNOSIS — R531 Weakness: Secondary | ICD-10-CM

## 2015-04-22 DIAGNOSIS — Z801 Family history of malignant neoplasm of trachea, bronchus and lung: Secondary | ICD-10-CM

## 2015-04-22 DIAGNOSIS — Z86718 Personal history of other venous thrombosis and embolism: Secondary | ICD-10-CM

## 2015-04-22 DIAGNOSIS — C19 Malignant neoplasm of rectosigmoid junction: Secondary | ICD-10-CM

## 2015-04-22 NOTE — Progress Notes (Signed)
Patient does not have living will.  Declined information.  Never smoked. 

## 2015-05-01 ENCOUNTER — Encounter: Payer: Self-pay | Admitting: Oncology

## 2015-05-01 NOTE — Progress Notes (Signed)
Chief Complaint/Diagnosis:   Primary Care Physician:  Dr. 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  AJCC Staging: pT3N1M0 Stage Grouping: IIIA with VI Cancer Status: No evidence of disease HPI:   patient is here for ongoing evaluation and treatment consideration regarding colon cancer.  Getting regular mammogram done.  And no abdominal pain.  No nausea.  No vomiting.  No diarrhea.. August, 2016 Patient is here for ongoing evaluation regarding carcinoma of colon Patient is getting regular colonoscopy done No rectal bleeding.  No abdominal pain.    Review of Systems:  General: weakness  fatigue  Performance Status (ECOG): 0  HEENT: no complaints  Lungs: no complaints  Cardiac: no complaints  GI: no complaints  GU: no complaints  Extremities: no complaints  Endocrine: no complaints  tremors  Psych: no complaints  Pain ?: No complaints (0, none)  Review of Systems: All other systems were reviewed and found to be negative   Allergies:  No Known Allergies:   Significant History/PMH:   blood clot to leg: 09-Feb-2006   colon CA:    drainage of abcess, revision colostomy, closure of dehiscenc: 08-Jan-2006   exploratory lab hartman's procedure: 02-Jan-2006   lap assisted sigmoid colectomy: 28-Dec-2005   portacath:   Preventive Screening:  Has patient had any of the following test? Colonscopy  Mammography  Pap Smear   Last Colonoscopy: 2010   Last Mammography: Due 02/2012   Last Pap Smear: 02/2012   PFSH: Additional Past Medical and Surgical History: Past Medical History:  No significant past medical history.    Past Surgical History:  Gallbladder surgery.    Hernia repair.    Family History:  Father had lung cancer. No family history of colorectal cancer, breast cancer, or ovarian cancer.       Social History:  Does not smoke and does not drink.   Home  Medications: Medication Instructions Last Modified Date/Time  metroNIDAZOLE 500 mg oral tablet 1 tab(s) orally every 8 hours x 8 days 13-Aug-15 11:12  ciprofloxacin 500 mg oral tablet 1 tab(s) orally every 12 hours x 8 days 13-Aug-15 11:12  Fosamax 70 mg oral tablet 1 tab(s) orally once a week 13-Aug-15 11:12   Vital Signs:  :: Wt(KG): 51.6 Temp: 96.6 Pulse: 73 RR: 16  BP: 126/78   Physical Exam:  General: patient  is alert oriented not any acute distress  Mental Status: alert and oriented to person, place and time  Head, Ears, Nose,Throat: Examination of head: Normocephalic      Examination of  throat and mouth: No evidence of stomatitis  Neck, Thyroid: no thyroid tenderness, enlargement or nodule.  neck supple without massess or tenderness. no adenopathy.  no carotid bruit.  Respiratory: no rales, rhonchi, retractions or wheezing  Cardiovascular: regular rate and rhythm, no murmur, rub or gallop  Gastrointestinal: Abdomen: Soft   Liver not palpable   Spleen not palpable   No tenderness   No ascites   Bowel sounds are present  Musculoskeletal: no muscular asymmetry noted.  no swelling or tenderness of joints.  ROM upper and lower extremities normal  Skin: no rashes, ulcers, or lesions  Lymphatics: no cervical, axillary, or inguinal lymphadenopathy   Lab Results Review:  Lab Results    Component     Latest Ref Rng 07/04/2010 01/02/2011 01/25/2012 01/21/2013 01/27/2014  CEA     0.0 - 4.7 ng/mL 1.3 1.2 1.3 1.2 1.3  Component     Latest Ref Rng 04/14/2015  CEA     0.0 - 4.7 ng/mL 1.0    =========================================================================  Assessment and Plan: Impression:   1. Colorectal cancer, without evidence of recurrence.colonoscopy in 2016 CEA remains stable  Plan:   At this point in time no further oncology follow-up is required.  Patient will continue her regular follow-up with primary care physician.  With colonoscopy.  And mammogram.   I  will be happy to see her again if there is any abnormal findings

## 2015-06-07 ENCOUNTER — Ambulatory Visit (INDEPENDENT_AMBULATORY_CARE_PROVIDER_SITE_OTHER): Payer: PPO | Admitting: Family Medicine

## 2015-06-07 ENCOUNTER — Encounter: Payer: Self-pay | Admitting: Family Medicine

## 2015-06-07 VITALS — BP 118/78 | HR 77 | Temp 98.4°F | Ht 60.25 in | Wt 115.5 lb

## 2015-06-07 DIAGNOSIS — J029 Acute pharyngitis, unspecified: Secondary | ICD-10-CM

## 2015-06-07 DIAGNOSIS — J020 Streptococcal pharyngitis: Secondary | ICD-10-CM | POA: Insufficient documentation

## 2015-06-07 LAB — POCT RAPID STREP A (OFFICE): Rapid Strep A Screen: POSITIVE — AB

## 2015-06-07 MED ORDER — AMOXICILLIN 500 MG PO CAPS: 500.0000 mg | ORAL_CAPSULE | Freq: Three times a day (TID) | ORAL | Status: DC

## 2015-06-07 NOTE — Progress Notes (Signed)
Subjective:    Patient ID: Lori Peters, female    DOB: 02/12/1946, 69 y.o.   MRN: [id number]  HPI Here for sore throat   Symptoms started several days ago  Constantly clearing throat - and it hurts (? If drainage)  Throat pain is not severe  It burns a little in her mouth also  A bit hoarse   Results for orders placed or performed in visit on 06/07/15  Rapid Strep A  Result Value Ref Range   Rapid Strep A Screen Positive (A) Negative    Has GERD as well  Takes omeprazole - takes an hour before she eats in the afternoon  She has heartburn symptoms occasionally -not bad    No fever  A little headache occ  Has been tired  No rash   Patient Active Problem List   Diagnosis Date Noted  . Left-sided thoracic back pain 03/25/2015  . Viral URI with cough 01/02/2014  . Enterocolitis 04/30/2013  . GERD 04/17/2008  . History of shingles 04/03/2007  . HYPERCHOLESTEROLEMIA 04/03/2007  . OSTEOARTHRITIS 04/03/2007  . OSTEOPENIA 04/03/2007  . COLON CANCER, HX OF 04/03/2007  . DVT, HX OF 04/03/2007   No past medical history on file. No past surgical history on file. Social History  Substance Use Topics  . Smoking status: Never Smoker   . Smokeless tobacco: Never Used  . Alcohol Use: No   No family history on file. Allergies  Allergen Reactions  . Garlic     REACTION: Rash   Current Outpatient Prescriptions on File Prior to Visit  Medication Sig Dispense Refill  . alendronate (FOSAMAX) 70 MG tablet Take 70 mg by mouth every 7 (seven) days. Take with a full glass of water on an empty stomach.    . calcium carbonate 200 MG capsule Take 250 mg by mouth 2 (two) times daily with a meal.    . cyclobenzaprine (FLEXERIL) 5 MG tablet Take 1 tablet (5 mg total) by mouth at bedtime. 30 tablet 0  . omeprazole (PRILOSEC) 20 MG capsule Take 1 capsule (20 mg total) by mouth daily. 30 capsule 3   No current facility-administered medications on file prior to visit.     Review of  Systems Review of Systems  Constitutional: Negative for fever, appetite change, fatigue and unexpected weight change.  ENT pos for sore throat and globus sensation , neg for ear pain , pos for post drip, neg for sinus pain Eyes: Negative for pain and visual disturbance.  Respiratory: Negative for cough and shortness of breath.   Cardiovascular: Negative for cp or palpitations    Gastrointestinal: Negative for nausea, diarrhea and constipation. pos for occ gerd symptoms  Genitourinary: Negative for urgency and frequency.  Skin: Negative for pallor or rash   Neurological: Negative for weakness, light-headedness, numbness and headaches.  Hematological: Negative for adenopathy. Does not bruise/bleed easily.  Psychiatric/Behavioral: Negative for dysphoric mood. The patient is not nervous/anxious.         Objective:   Physical Exam  Constitutional: She appears well-developed and well-nourished. No distress.  HENT:  Head: Normocephalic and atraumatic.  Right Ear: External ear normal.  Left Ear: External ear normal.  Mouth/Throat: No oropharyngeal exudate.  Nares are injected and boggy No sinus tenderness Clear rhinorrhea and post nasal drip  Some mild injection of throat without swelling or exudate   Eyes: Conjunctivae and EOM are normal. Pupils are equal, round, and reactive to light. Right eye exhibits no discharge. Left eye  exhibits no discharge.  Neck: Normal range of motion. Neck supple.  Cardiovascular: Normal rate and normal heart sounds.   Pulmonary/Chest: Effort normal and breath sounds normal. No respiratory distress. She has no wheezes. She has no rales. She exhibits no tenderness.  Lymphadenopathy:    She has no cervical adenopathy.  Neurological: She is alert.  Skin: Skin is warm and dry. No rash noted.  Psychiatric: She has a normal mood and affect.          Assessment & Plan:   Problem List Items Addressed This Visit      Respiratory   Streptococcal sore  throat    Pos strep test with sore throat  Some symptoms seem unrelated- ie: post nasal drip and gerd   Cover with amox Antihistamine prn  Salt water gargles Change PPI dose to am   Disc symptomatic care - see instructions on AVS  Update if not starting to improve in a week or if worsening         Other Visit Diagnoses    Sore throat    -  Primary    Relevant Orders    Rapid Strep A (Completed)

## 2015-06-07 NOTE — Progress Notes (Signed)
Pre visit review using our clinic review tool, if applicable. No additional management support is needed unless otherwise documented below in the visit note. 

## 2015-06-07 NOTE — Patient Instructions (Addendum)
Your strep test is positive  Take amoxicillin as directed  Rest/fluids  Gargle with salt water  Tylenol for fever or sore throat   For post nasal drip -try claritin over the counter  Also change your omeprazole (prilosec) to am before breakfast  Update if not starting to improve in a week or if worsening    Hold fosamax until better  Then try again-if it causes worse reflux or throat symptoms-stop it for good

## 2015-06-07 NOTE — Assessment & Plan Note (Signed)
Pos strep test with sore throat  Some symptoms seem unrelated- ie: post nasal drip and gerd   Cover with amox Antihistamine prn  Salt water gargles Change PPI dose to am   Disc symptomatic care - see instructions on AVS  Update if not starting to improve in a week or if worsening

## 2015-06-10 ENCOUNTER — Telehealth: Payer: Self-pay

## 2015-06-10 NOTE — Telephone Encounter (Signed)
Pt wanted to verify how long she is contagious with Strep A; advised pt without abx can be contagious for 2 - 3 weeks but after start treatment with abx not contagious after 24 hours starting abx. Pt voiced understanding. Sending to Dr Glori Bickers as Juluis Rainier.

## 2015-07-06 ENCOUNTER — Other Ambulatory Visit: Payer: Self-pay | Admitting: Gastroenterology

## 2015-07-20 ENCOUNTER — Ambulatory Visit (INDEPENDENT_AMBULATORY_CARE_PROVIDER_SITE_OTHER): Payer: PPO

## 2015-07-20 DIAGNOSIS — Z23 Encounter for immunization: Secondary | ICD-10-CM | POA: Diagnosis not present

## 2015-07-27 ENCOUNTER — Ambulatory Visit (INDEPENDENT_AMBULATORY_CARE_PROVIDER_SITE_OTHER): Payer: PPO | Admitting: Internal Medicine

## 2015-07-27 ENCOUNTER — Encounter: Payer: Self-pay | Admitting: Internal Medicine

## 2015-07-27 VITALS — BP 112/70 | HR 88 | Temp 97.9°F | Wt 113.0 lb

## 2015-07-27 DIAGNOSIS — H6981 Other specified disorders of Eustachian tube, right ear: Secondary | ICD-10-CM

## 2015-07-27 NOTE — Patient Instructions (Signed)

## 2015-07-27 NOTE — Progress Notes (Signed)
Subjective:    Patient ID: Lori Peters, female    DOB: 1946/07/18, 69 y.o.   MRN: [id number]  HPI  Pt presents to the clinic today with a "humming" sound in her right ear. This started 3-4 days ago. She does feel some ear pressure but denies pain. She has had a slight headache. She has had some intermittent loss of hearing during this time. She denies runny nose or nasal congestion. She denies fever, chills or body aches. She has tried Sudafed and an antihistamine with minimal relief.  Review of Systems  No past medical history on file.  Current Outpatient Prescriptions  Medication Sig Dispense Refill  . alendronate (FOSAMAX) 70 MG tablet Take 70 mg by mouth every 7 (seven) days. Take with a full glass of water on an empty stomach.    . calcium carbonate 200 MG capsule Take 250 mg by mouth 2 (two) times daily with a meal.    . cyclobenzaprine (FLEXERIL) 5 MG tablet Take 1 tablet (5 mg total) by mouth at bedtime. 30 tablet 0  . omeprazole (PRILOSEC) 20 MG capsule Take 1 capsule (20 mg total) by mouth daily. 30 capsule 3   No current facility-administered medications for this visit.    Allergies  Allergen Reactions  . Garlic     REACTION: Rash    No family history on file.  Social History   Social History  . Marital Status: Married    Spouse Name: N/A  . Number of Children: N/A  . Years of Education: N/A   Occupational History  . Not on file.   Social History Main Topics  . Smoking status: Never Smoker   . Smokeless tobacco: Never Used  . Alcohol Use: No  . Drug Use: No  . Sexual Activity: Not on file   Other Topics Concern  . Not on file   Social History Narrative     Constitutional: Pt reports headache. Denies fever, malaise, fatigue, or abrupt weight changes.  HEENT: Pt reports ear fullness. Denies eye pain, eye redness, ear pain, wax buildup, runny nose, nasal congestion, bloody nose, or sore throat. Respiratory: Denies difficulty breathing, shortness  of breath, cough or sputum production.   Cardiovascular: Denies chest pain, chest tightness, palpitations or swelling in the hands or feet.   No other specific complaints in a complete review of systems (except as listed in HPI above).      Objective:   Physical Exam   BP 112/70 mmHg  Pulse 88  Temp(Src) 97.9 F (36.6 C) (Oral)  Wt 113 lb (51.256 kg)  SpO2 98% Wt Readings from Last 3 Encounters:  07/27/15 113 lb (51.256 kg)  06/07/15 115 lb 8 oz (52.39 kg)  04/22/15 114 lb 2 oz (51.767 kg)    General: Appears her stated age, well developed, well nourished in NAD. HEENT: Head: normal shape and size; Right Ears: Tm's pink but intact, normal light reflex, + scar tissue noted on TM and serous effusion; Throat/Mouth: Teeth present, mucosa pink and moist, no exudate, lesions or ulcerations noted.  Neck:  No adenopathy noted. Cardiovascular: Normal rate and rhythm. S1,S2 noted.  No murmur, rubs or gallops noted.  Pulmonary/Chest: Normal effort and positive vesicular breath sounds. No respiratory distress. No wheezes, rales or ronchi noted.   BMET    Component Value Date/Time   NA 136 04/14/2015 1345   NA 142 01/27/2014 1010   K 4.0 04/14/2015 1345   K 4.2 01/27/2014 1010   CL 105  04/14/2015 1345   CL 104 01/27/2014 1010   CO2 26 04/14/2015 1345   CO2 30 01/27/2014 1010   GLUCOSE 96 04/14/2015 1345   GLUCOSE 80 01/27/2014 1010   BUN 13 04/14/2015 1345   BUN 14 01/27/2014 1010   CREATININE 0.56 04/14/2015 1345   CREATININE 0.57* 01/27/2014 1010   CALCIUM 8.8* 04/14/2015 1345   CALCIUM 8.9 01/27/2014 1010   GFRNONAA >60 04/14/2015 1345   GFRNONAA >60 01/27/2014 1010   GFRAA >60 04/14/2015 1345   GFRAA >60 01/27/2014 1010    Lipid Panel     Component Value Date/Time   CHOL 193 04/30/2013 1201   TRIG 157.0* 04/30/2013 1201   HDL 68.80 04/30/2013 1201   CHOLHDL 3 04/30/2013 1201   VLDL 31.4 04/30/2013 1201   LDLCALC 93 04/30/2013 1201    CBC    Component Value  Date/Time   WBC 6.7 04/14/2015 1345   WBC 6.3 01/27/2014 1010   RBC 4.39 04/14/2015 1345   RBC 4.34 01/27/2014 1010   HGB 12.8 04/14/2015 1345   HGB 12.8 01/27/2014 1010   HCT 38.1 04/14/2015 1345   HCT 39.1 01/27/2014 1010   PLT 217 04/14/2015 1345   PLT 219 01/27/2014 1010   MCV 86.7 04/14/2015 1345   MCV 90 01/27/2014 1010   MCH 29.1 04/14/2015 1345   MCH 29.5 01/27/2014 1010   MCHC 33.6 04/14/2015 1345   MCHC 32.8 01/27/2014 1010   RDW 13.6 04/14/2015 1345   RDW 13.5 01/27/2014 1010   LYMPHSABS 2.9 04/14/2015 1345   LYMPHSABS 2.8 01/27/2014 1010   MONOABS 0.5 04/14/2015 1345   MONOABS 0.5 01/27/2014 1010   EOSABS 0.0 04/14/2015 1345   EOSABS 0.0 01/27/2014 1010   BASOSABS 0.0 04/14/2015 1345   BASOSABS 0.0 01/27/2014 1010    Hgb A1C No results found for: HGBA1C      Assessment & Plan:   ETD, right:  She will try Flonase, right nare daily x 1 week If no improvement, will consider a round of Prednisone If persist, may need referral to ENT  RTC as needed or if symptoms persist or worsen

## 2015-07-27 NOTE — Progress Notes (Signed)
Pre visit review using our clinic review tool, if applicable. No additional management support is needed unless otherwise documented below in the visit note. 

## 2015-07-28 ENCOUNTER — Telehealth: Payer: Self-pay

## 2015-07-28 NOTE — Telephone Encounter (Signed)
Pt is aware as instructed 

## 2015-07-28 NOTE — Telephone Encounter (Signed)
Pt left v/m; pt was told to take flonase at 07/27/15 appt and literature with med said not to take if has cataracts or glaucoma; pt has forming of cataracts and pt wants to know if should continue taking flonase.pt request cb.

## 2015-07-28 NOTE — Telephone Encounter (Signed)
This is not something I am aware of. It should be fine for short term use though.

## 2015-10-07 DIAGNOSIS — H2513 Age-related nuclear cataract, bilateral: Secondary | ICD-10-CM | POA: Diagnosis not present

## 2015-11-25 ENCOUNTER — Encounter: Payer: Self-pay | Admitting: Internal Medicine

## 2015-11-25 ENCOUNTER — Ambulatory Visit (INDEPENDENT_AMBULATORY_CARE_PROVIDER_SITE_OTHER): Payer: PPO | Admitting: Internal Medicine

## 2015-11-25 VITALS — BP 110/72 | HR 92 | Temp 99.5°F | Wt 112.0 lb

## 2015-11-25 DIAGNOSIS — R509 Fever, unspecified: Secondary | ICD-10-CM

## 2015-11-25 DIAGNOSIS — J069 Acute upper respiratory infection, unspecified: Secondary | ICD-10-CM

## 2015-11-25 DIAGNOSIS — B9789 Other viral agents as the cause of diseases classified elsewhere: Principal | ICD-10-CM

## 2015-11-25 LAB — POCT INFLUENZA A/B
INFLUENZA B, POC: NEGATIVE
Influenza A, POC: NEGATIVE

## 2015-11-25 MED ORDER — HYDROCOD POLST-CPM POLST ER 10-8 MG/5ML PO SUER: 5.0000 mL | Freq: Every evening | ORAL | Status: DC | PRN

## 2015-11-25 NOTE — Progress Notes (Signed)
Pre visit review using our clinic review tool, if applicable. No additional management support is needed unless otherwise documented below in the visit note. 

## 2015-11-25 NOTE — Progress Notes (Signed)
HPI  Pt presents to the clinic today with c/o runny nose and cough. This started 2-3 days ago. She is blowing clear mucous out of her nose. The cough is non productive. She denies shortness of breath, but has had fever, chills and body aches. She has not tried anything OTC. She has no history of allergies or breathing problems. She has not had sick contacts. She did get her flu shot.  Review of Systems     History reviewed. No pertinent past medical history.  History reviewed. No pertinent family history.  Social History   Social History  . Marital Status: Married    Spouse Name: N/A  . Number of Children: N/A  . Years of Education: N/A   Occupational History  . Not on file.   Social History Main Topics  . Smoking status: Never Smoker   . Smokeless tobacco: Never Used  . Alcohol Use: No  . Drug Use: No  . Sexual Activity: Not on file   Other Topics Concern  . Not on file   Social History Narrative    Allergies  Allergen Reactions  . Garlic     REACTION: Rash     Constitutional: Positive headache, fatigue and fever. Denies abrupt weight changes.  HEENT:  Positive runny nose. Denies eye redness, eye pain, pressure behind the eyes, facial pain, nasal congestion, ear pain, ringing in the ears, wax buildup, or sore throat. Respiratory: Positive cough. Denies difficulty breathing or shortness of breath.  Cardiovascular: Denies chest pain, chest tightness, palpitations or swelling in the hands or feet.   No other specific complaints in a complete review of systems (except as listed in HPI above).  Objective:   BP 110/72 mmHg  Pulse 92  Temp(Src) 99.5 F (37.5 C) (Oral)  Wt 112 lb (50.803 kg)  SpO2 97% Wt Readings from Last 3 Encounters:  11/25/15 112 lb (50.803 kg)  07/27/15 113 lb (51.256 kg)  06/07/15 115 lb 8 oz (52.39 kg)     General: Appears her stated age, in NAD. HEENT: Head: normal shape and size, no sinus tenderness noted; Eyes: sclera white, no  icterus, conjunctiva pink; Ears: Tm's gray and intact, normal light reflex; Nose: mucosa pink and moist, septum midline; Throat/Mouth: Teeth present, mucosa pink and moist, no exudate noted, no lesions or ulcerations noted.  Neck: No cervical lymphadenopathy.  Cardiovascular: Normal rate and rhythm. S1,S2 noted.  No murmur, rubs or gallops noted.  Pulmonary/Chest: Normal effort and positive vesicular breath sounds. No respiratory distress. No wheezes, rales or ronchi noted.      Assessment & Plan:   Viral Upper Respiratory Infection with Cough:  Rapid Flu: negative Get some rest and drink plenty of water Tylenol/Ibuprofen for fever and body aches Rx for Tussionex cough syrup  RTC as needed or if symptoms persist.

## 2015-11-25 NOTE — Patient Instructions (Signed)

## 2015-11-25 NOTE — Addendum Note (Signed)
Addended by: Lurlean Nanny on: 11/25/2015 11:05 AM   Modules accepted: Orders

## 2016-03-16 DIAGNOSIS — L812 Freckles: Secondary | ICD-10-CM | POA: Diagnosis not present

## 2016-03-16 DIAGNOSIS — L820 Inflamed seborrheic keratosis: Secondary | ICD-10-CM | POA: Diagnosis not present

## 2016-03-16 DIAGNOSIS — I781 Nevus, non-neoplastic: Secondary | ICD-10-CM | POA: Diagnosis not present

## 2016-03-16 DIAGNOSIS — D229 Melanocytic nevi, unspecified: Secondary | ICD-10-CM | POA: Diagnosis not present

## 2016-03-16 DIAGNOSIS — L720 Epidermal cyst: Secondary | ICD-10-CM | POA: Diagnosis not present

## 2016-03-16 DIAGNOSIS — Z1283 Encounter for screening for malignant neoplasm of skin: Secondary | ICD-10-CM | POA: Diagnosis not present

## 2016-03-16 DIAGNOSIS — I8393 Asymptomatic varicose veins of bilateral lower extremities: Secondary | ICD-10-CM | POA: Diagnosis not present

## 2016-03-16 DIAGNOSIS — D1800 Hemangioma unspecified site: Secondary | ICD-10-CM | POA: Diagnosis not present

## 2016-03-16 DIAGNOSIS — L821 Other seborrheic keratosis: Secondary | ICD-10-CM | POA: Diagnosis not present

## 2016-03-16 DIAGNOSIS — L578 Other skin changes due to chronic exposure to nonionizing radiation: Secondary | ICD-10-CM | POA: Diagnosis not present

## 2016-05-29 DIAGNOSIS — N958 Other specified menopausal and perimenopausal disorders: Secondary | ICD-10-CM | POA: Diagnosis not present

## 2016-05-29 DIAGNOSIS — M8588 Other specified disorders of bone density and structure, other site: Secondary | ICD-10-CM | POA: Diagnosis not present

## 2016-05-29 LAB — HM DEXA SCAN

## 2016-06-08 ENCOUNTER — Ambulatory Visit (INDEPENDENT_AMBULATORY_CARE_PROVIDER_SITE_OTHER): Payer: PPO

## 2016-06-08 DIAGNOSIS — Z23 Encounter for immunization: Secondary | ICD-10-CM

## 2016-08-03 ENCOUNTER — Telehealth: Payer: Self-pay | Admitting: Family Medicine

## 2016-08-03 DIAGNOSIS — Z1159 Encounter for screening for other viral diseases: Secondary | ICD-10-CM | POA: Insufficient documentation

## 2016-08-03 DIAGNOSIS — Z0000 Encounter for general adult medical examination without abnormal findings: Secondary | ICD-10-CM | POA: Insufficient documentation

## 2016-08-03 NOTE — Telephone Encounter (Signed)
-----   Message from Eustace Pen, LPN sent at [id number]  4:38 PM EST ----- Regarding: Lab Orders 12/1 Please add Hep C to lab orders, if any. Thank you.

## 2016-08-04 ENCOUNTER — Ambulatory Visit (INDEPENDENT_AMBULATORY_CARE_PROVIDER_SITE_OTHER): Payer: PPO

## 2016-08-04 VITALS — BP 102/68 | HR 79 | Temp 97.9°F | Ht 60.0 in | Wt 112.2 lb

## 2016-08-04 DIAGNOSIS — Z0000 Encounter for general adult medical examination without abnormal findings: Secondary | ICD-10-CM | POA: Diagnosis not present

## 2016-08-04 DIAGNOSIS — Z23 Encounter for immunization: Secondary | ICD-10-CM | POA: Diagnosis not present

## 2016-08-04 DIAGNOSIS — Z1159 Encounter for screening for other viral diseases: Secondary | ICD-10-CM | POA: Diagnosis not present

## 2016-08-04 LAB — TSH: TSH: 1.41 u[IU]/mL (ref 0.35–4.50)

## 2016-08-04 LAB — CBC WITH DIFFERENTIAL/PLATELET
BASOS PCT: 0.2 % (ref 0.0–3.0)
Basophils Absolute: 0 10*3/uL (ref 0.0–0.1)
EOS PCT: 0.7 % (ref 0.0–5.0)
Eosinophils Absolute: 0.1 10*3/uL (ref 0.0–0.7)
HCT: 39.2 % (ref 36.0–46.0)
Hemoglobin: 13.2 g/dL (ref 12.0–15.0)
LYMPHS ABS: 3.4 10*3/uL (ref 0.7–4.0)
Lymphocytes Relative: 39.7 % (ref 12.0–46.0)
MCHC: 33.7 g/dL (ref 30.0–36.0)
MCV: 86.9 fl (ref 78.0–100.0)
MONO ABS: 0.5 10*3/uL (ref 0.1–1.0)
MONOS PCT: 5.5 % (ref 3.0–12.0)
Neutro Abs: 4.7 10*3/uL (ref 1.4–7.7)
Neutrophils Relative %: 53.9 % (ref 43.0–77.0)
Platelets: 254 10*3/uL (ref 150.0–400.0)
RBC: 4.5 Mil/uL (ref 3.87–5.11)
RDW: 13.7 % (ref 11.5–15.5)
WBC: 8.6 10*3/uL (ref 4.0–10.5)

## 2016-08-04 LAB — COMPREHENSIVE METABOLIC PANEL
ALK PHOS: 45 U/L (ref 39–117)
ALT: 15 U/L (ref 0–35)
AST: 22 U/L (ref 0–37)
Albumin: 4.6 g/dL (ref 3.5–5.2)
BUN: 13 mg/dL (ref 6–23)
CHLORIDE: 104 meq/L (ref 96–112)
CO2: 28 mEq/L (ref 19–32)
Calcium: 9.8 mg/dL (ref 8.4–10.5)
Creatinine, Ser: 0.7 mg/dL (ref 0.40–1.20)
GFR: 87.66 mL/min (ref >60.00)
Glucose, Bld: 97 mg/dL (ref 70–99)
POTASSIUM: 4.2 meq/L (ref 3.5–5.1)
SODIUM: 142 meq/L (ref 135–145)
TOTAL PROTEIN: 7.3 g/dL (ref 6.0–8.3)
Total Bilirubin: 0.4 mg/dL (ref 0.2–1.2)

## 2016-08-04 LAB — LIPID PANEL
CHOLESTEROL: 256 mg/dL — AB (ref 0–200)
HDL: 72 mg/dL (ref >39.00)
LDL CALC: 150 mg/dL — AB (ref 0–99)
NonHDL: 183.87
Total CHOL/HDL Ratio: 4
Triglycerides: 170 mg/dL — ABNORMAL HIGH (ref 0.0–149.0)
VLDL: 34 mg/dL (ref 0.0–40.0)

## 2016-08-04 NOTE — Progress Notes (Signed)
Pre visit review using our clinic review tool, if applicable. No additional management support is needed unless otherwise documented below in the visit note. 

## 2016-08-04 NOTE — Progress Notes (Signed)
PCP notes:   Health maintenance:  Hep C screening - completed PCV13 - administered Tetanus - postponed/insurance Bone density - completed by Dr. Matthew Saras on 05/29/16  Abnormal screenings:   Hearing - failed  Patient concerns:   None  Nurse concerns:  None  Next PCP appt:   08/14/2016 @ 1215  I reviewed health advisor's note, was available for consultation, and agree with documentation and plan. Loura Pardon MD

## 2016-08-04 NOTE — Patient Instructions (Signed)
Lori Peters , Thank you for taking time to come for your Medicare Wellness Visit. I appreciate your ongoing commitment to your health goals. Please review the following plan we discussed and let me know if I can assist you in the future.   These are the goals we discussed: Goals    . Increase physical activity          Starting 09/04/2016, I will attempt to walk at least 60 min 3-4 days per week.        This is a list of the screening recommended for you and due dates:  Health Maintenance  Topic Date Due  . Tetanus Vaccine  08/04/2026*  . Mammogram  06/04/2017  . Colon Cancer Screening  07/05/2025  . Flu Shot  Completed  . DEXA scan (bone density measurement)  Addressed  . Shingles Vaccine  Completed  .  Hepatitis C: One time screening is recommended by Center for Disease Control  (CDC) for  adults born from [birth year] through 1966.   Completed  . Pneumonia vaccines  Completed  *Topic was postponed. The date shown is not the original due date.   Preventive Care for Adults  A healthy lifestyle and preventive care can promote health and wellness. Preventive health guidelines for adults include the following key practices.  . A routine yearly physical is a good way to check with your health care provider about your health and preventive screening. It is a chance to share any concerns and updates on your health and to receive a thorough exam.  . Visit your dentist for a routine exam and preventive care every 6 months. Brush your teeth twice a day and floss once a day. Good oral hygiene prevents tooth decay and gum disease.  . The frequency of eye exams is based on your age, health, family medical history, use  of contact lenses, and other factors. Follow your health care provider's ecommendations for frequency of eye exams.  . Eat a healthy diet. Foods like vegetables, fruits, whole grains, low-fat dairy products, and lean protein foods contain the nutrients you need without too many calories.  Decrease your intake of foods high in solid fats, added sugars, and salt. Eat the right amount of calories for you. Get information about a proper diet from your health care provider, if necessary.  . Regular physical exercise is one of the most important things you can do for your health. Most adults should get at least 150 minutes of moderate-intensity exercise (any activity that increases your heart rate and causes you to sweat) each week. In addition, most adults need muscle-strengthening exercises on 2 or more days a week.  Silver Sneakers may be a benefit available to you. To determine eligibility, you may visit the website: www.silversneakers.com or contact program at [phone] Mon-Fri between 8AM-8PM.   . Maintain a healthy weight. The body mass index (BMI) is a screening tool to identify possible weight problems. It provides an estimate of body fat based on height and weight. Your health care provider can find your BMI and can help you achieve or maintain a healthy weight.   For adults 20 years and older: ? A BMI below 18.5 is considered underweight. ? A BMI of 18.5 to 24.9 is normal. ? A BMI of 25 to 29.9 is considered overweight. ? A BMI of 30 and above is considered obese.   . Maintain normal blood lipids and cholesterol levels by exercising and minimizing your intake of saturated fat. Eat a  balanced diet with plenty of fruit and vegetables. Blood tests for lipids and cholesterol should begin at age 27 and be repeated every 5 years. If your lipid or cholesterol levels are high, you are over 50, or you are at high risk for heart disease, you may need your cholesterol levels checked more frequently. Ongoing high lipid and cholesterol levels should be treated with medicines if diet and exercise are not working.  . If you smoke, find out from your health care provider how to quit. If you do not use tobacco, please do not start.  . If you choose to drink alcohol, please do not consume  more than 2 drinks per day. One drink is considered to be 12 ounces (355 mL) of beer, 5 ounces (148 mL) of wine, or 1.5 ounces (44 mL) of liquor.  . If you are 52-59 years old, ask your health care provider if you should take aspirin to prevent strokes.  . Use sunscreen. Apply sunscreen liberally and repeatedly throughout the day. You should seek shade when your shadow is shorter than you. Protect yourself by wearing long sleeves, pants, a wide-brimmed hat, and sunglasses year round, whenever you are outdoors.  . Once a month, do a whole body skin exam, using a mirror to look at the skin on your back. Tell your health care provider of new moles, moles that have irregular borders, moles that are larger than a pencil eraser, or moles that have changed in shape or color.

## 2016-08-04 NOTE — Progress Notes (Signed)
Subjective:   Lori Peters is a 70 y.o. female who presents for an Initial Medicare Annual Wellness Visit.  Review of Systems    N/A  Cardiac Risk Factors include: advanced age (>61men, >64 women);dyslipidemia     Objective:    Today's Vitals   08/04/16 1413  BP: 102/68  Pulse: 79  Temp: 97.9 F (36.6 C)  TempSrc: Oral  SpO2: 97%  Weight: 112 lb 4 oz (50.9 kg)  Height: 5' (1.524 m)  PainSc: 0-No pain   Body mass index is 21.92 kg/m.   Current Medications (verified) Outpatient Encounter Prescriptions as of 08/04/2016  Medication Sig  . Calcium Carbonate-Vitamin D (CALTRATE 600+D PO) Take 1 tablet by mouth daily.  . [DISCONTINUED] calcium carbonate 200 MG capsule Take 250 mg by mouth 2 (two) times daily with a meal.  . [DISCONTINUED] chlorpheniramine-HYDROcodone (TUSSIONEX PENNKINETIC ER) 10-8 MG/5ML SUER Take 5 mLs by mouth at bedtime as needed.  . [DISCONTINUED] omeprazole (PRILOSEC) 20 MG capsule Take 1 capsule (20 mg total) by mouth daily.   No facility-administered encounter medications on file as of 08/04/2016.     Allergies (verified) Garlic   History: History reviewed. No pertinent past medical history. History reviewed. No pertinent surgical history. History reviewed. No pertinent family history. Social History   Occupational History  . Not on file.   Social History Main Topics  . Smoking status: Never Smoker  . Smokeless tobacco: Never Used  . Alcohol use No  . Drug use: No  . Sexual activity: Yes    Tobacco Counseling Counseling given: No   Activities of Daily Living In your present state of health, do you have any difficulty performing the following activities: 08/04/2016  Hearing? N  Vision? N  Difficulty concentrating or making decisions? N  Walking or climbing stairs? N  Dressing or bathing? N  Doing errands, shopping? N  Preparing Food and eating ? N  Using the Toilet? N  In the past six months, have you accidently leaked urine?  N  Do you have problems with loss of bowel control? N  Managing your Medications? N  Managing your Finances? N  Housekeeping or managing your Housekeeping? N  Some recent data might be hidden    Immunizations and Health Maintenance Immunization History  Administered Date(s) Administered  . Influenza Split 08/04/2011  . Influenza Whole 06/02/2008, 06/24/2009  . Influenza,inj,Quad PF,36+ Mos 04/30/2013, 06/16/2014, 07/20/2015, 06/08/2016  . Pneumococcal Conjugate-13 08/04/2016  . Pneumococcal Polysaccharide-23 04/30/2013  . Zoster 02/04/2013   There are no preventive care reminders to display for this patient.  Patient Care Team: Abner Greenspan, MD as PCP - [place], MD Molli Posey, MD as Consulting Physician (Obstetrics and Gynecology) Estill Cotta, MD as Consulting Physician (Ophthalmology) Ocie Cornfield, DMD as Consulting Physician (Dentistry)     Assessment:   This is a routine wellness examination for Lori Peters.   Hearing/Vision screen  Hearing Screening   125Hz  250Hz  500Hz  1000Hz  2000Hz  3000Hz  4000Hz  6000Hz  8000Hz   Right ear:   0 40 40  0    Left ear:   40 40 40  0    Vision Screening Comments: Last vision exam in 2017 with Dr. Sandra Cockayne  Dietary issues and exercise activities discussed: Current Exercise Habits: The patient does not participate in regular exercise at present, Exercise limited by: None identified  Goals    . Increase physical activity          Starting 09/04/2016, I will attempt to  walk at least 60 min 3-4 days per week.       Depression Screen PHQ 2/9 Scores 08/04/2016 04/30/2013  PHQ - 2 Score 0 0    Fall Risk Fall Risk  08/04/2016 04/30/2013  Falls in the past year? No No    Cognitive Function: MMSE - Mini Mental State Exam 08/04/2016  Orientation to time 5  Orientation to Place 5  Registration 3  Attention/ Calculation 0  Recall 3  Language- name 2 objects 0  Language- repeat 1  Language- follow 3 step command 3    Language- read & follow direction 0  Write a sentence 0  Copy design 0  Total score 20       PLEASE NOTE: A Mini-Cog screen was completed. Maximum score is 20. A value of 0 denotes this part of [place] MMSE was not completed or the patient failed this part of the Mini-Cog screening.   Mini-Cog Screening Orientation to Time - Max 5 pts Orientation to Place - Max 5 pts Registration - Max 3 pts Recall - Max 3 pts Language Repeat - Max 1 pts Language Follow 3 Step Command - Max 3 pts   Screening Tests Health Maintenance  Topic Date Due  . TETANUS/TDAP  08/04/2026 (Originally 07/16/1965)  . MAMMOGRAM  06/04/2017  . COLONOSCOPY  07/05/2025  . INFLUENZA VACCINE  Completed  . DEXA SCAN  Addressed  . ZOSTAVAX  Completed  . Hepatitis C Screening  Completed  . PNA vac Low Risk Adult  Completed      Plan:     I have personally reviewed and addressed the Medicare Annual Wellness questionnaire and have noted the following in the patient's chart:  A. Medical and social history B. Use of alcohol, tobacco or illicit drugs  C. Current medications and supplements D. Functional ability and status E.  Nutritional status F.  Physical activity G. Advance directives H. List of other physicians I.  Hospitalizations, surgeries, and ER visits in previous 12 months J.  [place] to include hearing, vision, cognitive, depression L. Referrals and appointments - none  In addition, I have reviewed and discussed with patient certain preventive protocols, quality metrics, and best practice recommendations. A written personalized care plan for preventive services as well as general preventive health recommendations were provided to patient.  See attached scanned questionnaire for additional information.   Signed,   Lindell Noe, MHA, BS, LPN Health Coach

## 2016-08-05 LAB — HEPATITIS C ANTIBODY: HCV Ab: NEGATIVE

## 2016-08-07 ENCOUNTER — Encounter: Payer: Self-pay | Admitting: Family Medicine

## 2016-08-14 ENCOUNTER — Encounter: Payer: Self-pay | Admitting: Family Medicine

## 2016-08-14 ENCOUNTER — Ambulatory Visit (INDEPENDENT_AMBULATORY_CARE_PROVIDER_SITE_OTHER): Payer: PPO | Admitting: Family Medicine

## 2016-08-14 VITALS — BP 128/72 | HR 80 | Temp 98.4°F | Ht 60.0 in | Wt 113.0 lb

## 2016-08-14 DIAGNOSIS — Z1159 Encounter for screening for other viral diseases: Secondary | ICD-10-CM | POA: Diagnosis not present

## 2016-08-14 DIAGNOSIS — Z85038 Personal history of other malignant neoplasm of large intestine: Secondary | ICD-10-CM

## 2016-08-14 DIAGNOSIS — Z0000 Encounter for general adult medical examination without abnormal findings: Secondary | ICD-10-CM

## 2016-08-14 DIAGNOSIS — E7800 Pure hypercholesterolemia, unspecified: Secondary | ICD-10-CM

## 2016-08-14 DIAGNOSIS — M8580 Other specified disorders of bone density and structure, unspecified site: Secondary | ICD-10-CM | POA: Diagnosis not present

## 2016-08-14 NOTE — Assessment & Plan Note (Addendum)
Reviewed health habits including diet and exercise and skin cancer prevention Reviewed appropriate screening tests for age  Also reviewed health mt list, fam hx and immunization status , as well as social and family history   See HPI AMW reviewed She does not desire further hearing eval at this time  Less time for self care lately  Disc low chol diet Disc osteopenia-she will disc w/gyn at her appt feb  Also get her mammogram then as well as gyn exam  Hep C screen neg utd imms except tetanus -she will check on coverage of that and perhaps get at a pharmacy Labs reviewed  Enc exercise when she has time for overall health

## 2016-08-14 NOTE — Progress Notes (Signed)
Subjective:    Patient ID: Lori Peters, female    DOB: 04/23/1946, 70 y.o.   MRN: [id number]  HPI Here for health maintenance exam and to review chronic medical problems    A tough month  A lot of health problems in her family  Husband -heart problems (doing better) and also angioplasty  Aunt getting out of the hospital today  Mother in late 34s   Other than stressed doing well    Was seen for AMW on 12/1 Hep C screening done-negative  PCV 13 given  Disc tetanus shot - will see about coverage    dexa -done by gyn 05/29/16 Dr Delanna Ahmadi office   Osteopenia - will talk about it more in Feb  On ca and D  Falls-none  Fractures - none  Has not been exercising  Was on fosamax - took it for a while and it caused some GI side eff (took for 5 years)    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  Not bothering pt at all and no one else c/o    Wt Readings from Last 3 Encounters:  08/14/16 113 lb (51.3 kg)  08/04/16 112 lb 4 oz (50.9 kg)  11/25/15 112 lb (50.8 kg)   bmi 22.07 Eats healthy  No time for exercise   Mammogram 10/16 - not sure if she had one in oct  She goes to gyn in Feb and will get it then  Self breast exam-no lumps   Colonoscopy 11/16- polyps found - will do next one in 5 years  Personal hx of colon cancer   Hx of hyperlipidemia Lab Results  Component Value Date   CHOL 256 (H) 08/04/2016   CHOL 193 04/30/2013   CHOL 278 (HH) 04/17/2008   Lab Results  Component Value Date   HDL 72.00 08/04/2016   HDL 68.80 04/30/2013   HDL 57.0 04/17/2008   Lab Results  Component Value Date   LDLCALC 150 (H) 08/04/2016   LDLCALC 93 04/30/2013   Lab Results  Component Value Date   TRIG 170.0 (H) 08/04/2016   TRIG 157.0 (H) 04/30/2013   TRIG 232 (HH) 04/17/2008   Lab Results  Component Value Date   CHOLHDL 4 08/04/2016   CHOLHDL 3 04/30/2013   CHOLHDL 4.9 CALC 04/17/2008   Lab Results  Component Value Date   LDLDIRECT 144.9 04/17/2008   Has not been on  cholesterol medicine  Diet "could be better"  No red meat Fried foods- once per month  Not a lot of cheese Ice cream - 2 times per month  No shellfish  occ bacon or sausage - 2 times per month  Not a lot of eggs or mayo  No biscuits   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 high cholesterol     Chemistry      Component Value Date/Time   NA 142 08/04/2016 1453   NA 142 01/27/2014 1010   K 4.2 08/04/2016 1453   K 4.2 01/27/2014 1010   CL 104 08/04/2016 1453   CL 104 01/27/2014 1010   CO2 28 08/04/2016 1453   CO2 30 01/27/2014 1010   BUN 13 08/04/2016 1453   BUN 14 01/27/2014 1010   CREATININE 0.70 08/04/2016 1453   CREATININE 0.57 (L) 01/27/2014 1010      Component Value Date/Time   CALCIUM 9.8 08/04/2016 1453   CALCIUM 8.9 01/27/2014 1010   ALKPHOS 45 08/04/2016 1453   ALKPHOS 51 01/27/2014 1010   AST 22 08/04/2016 1453   AST  17 01/27/2014 1010   ALT 15 08/04/2016 1453   ALT 20 01/27/2014 1010   BILITOT 0.4 08/04/2016 1453   BILITOT 0.4 01/27/2014 1010     Lab Results  Component Value Date   WBC 8.6 08/04/2016   HGB 13.2 08/04/2016   HCT 39.2 08/04/2016   MCV 86.9 08/04/2016   PLT 254.0 08/04/2016    Lab Results  Component Value Date   TSH 1.41 08/04/2016    Hep C screen neg   Patient Active Problem List   Diagnosis Date Noted  . Routine general medical examination at a health care facility 08/03/2016  . Encounter for hepatitis C screening test for low risk patient 08/03/2016  . Left-sided thoracic back pain 03/25/2015  . Enterocolitis 04/30/2013  . GERD 04/17/2008  . History of shingles 04/03/2007  . HYPERCHOLESTEROLEMIA 04/03/2007  . OSTEOARTHRITIS 04/03/2007  . Osteopenia 04/03/2007  . 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 DVT, HX OF 04/03/2007   No past medical history on file. No past surgical history on file. Social History  Substance Use Topics  . Smoking status: Never Smoker  . Smokeless tobacco: Never Used  . Alcohol use  No   No family history on file. Allergies  Allergen Reactions  . Fosamax [Alendronate Sodium]     GI side eff   . Garlic     REACTION: Rash   Current Outpatient Prescriptions on File Prior to Visit  Medication Sig Dispense Refill  . Calcium Carbonate-Vitamin D (CALTRATE 600+D PO) Take 1 tablet by mouth daily.     No current facility-administered medications on file prior to visit.     Review of Systems    Review of Systems  Constitutional: Negative for fever, appetite change, fatigue and unexpected weight change.  Eyes: Negative for pain and visual disturbance.  Respiratory: Negative for cough and shortness of breath.   Cardiovascular: Negative for cp or palpitations    Gastrointestinal: Negative for nausea, diarrhea and constipation.  Genitourinary: Negative for urgency and frequency.  Skin: Negative for pallor or rash   Neurological: Negative for weakness, light-headedness, numbness and headaches.  Hematological: Negative for adenopathy. Does not bruise/bleed easily.  Psychiatric/Behavioral: Negative for dysphoric mood. The patient is not nervous/anxious. Pos for caregiver stress and worry      Objective:   Physical Exam  Constitutional: She appears well-developed and well-nourished. No distress.  Well appearing   HENT:  Head: Normocephalic and atraumatic.  Right Ear: External ear normal.  Left Ear: External ear normal.  Nose: Nose normal.  Mouth/Throat: Oropharynx is clear and moist.  Eyes: Conjunctivae and EOM are normal. Pupils are equal, round, and reactive to light. Right eye exhibits no discharge. Left eye exhibits no discharge. No scleral icterus.  Neck: Normal range of motion. Neck supple. No JVD present. Carotid bruit is not present. No thyromegaly present.  Cardiovascular: Normal rate, regular rhythm, normal heart sounds and intact distal pulses.  Exam reveals no gallop.   Pulmonary/Chest: Effort normal and breath sounds normal. No respiratory distress. She has  no wheezes. She has no rales.  Abdominal: Soft. Bowel sounds are normal. She exhibits no distension and no mass. There is no tenderness.  Baseline scars  Genitourinary:  Genitourinary Comments: Pt will have breast and pelvic exam at gyn in feb   Musculoskeletal: She exhibits no edema or tenderness.  No kyphosis   Lymphadenopathy:    She has no cervical adenopathy.  Neurological: She is alert. She has normal reflexes. No  cranial nerve deficit. She exhibits normal muscle tone. Coordination normal.  Skin: Skin is warm and dry. No rash noted. No erythema. No pallor.  Fair complexion Lentigines diffusely (per pt just saw dermatology)   Psychiatric: She has a normal mood and affect.  Seems mildly anxious/worried today          Assessment & Plan:   Problem List Items Addressed This Visit      Musculoskeletal and Integument   Osteopenia - Primary    Last dexa from gyn lowest T score was -2.2 at [facility] Has had 5 years of fosamax  No falls or fx  No regular exercise- will start when she can  She will see gyn in Feb and disc pros/cons of other treatments  Stressed imp of ca and D        Other   Routine general medical examination at a health care facility    Reviewed health habits including diet and exercise and skin cancer prevention Reviewed appropriate screening tests for age  Also reviewed health mt list, fam hx and immunization status , as well as social and family history   See HPI AMW reviewed She does not desire further hearing eval at this time  Less time for self care lately  Disc low chol diet Disc osteopenia-she will disc w/gyn at her appt feb  Also get her mammogram then as well as gyn exam  Hep C screen neg utd imms except tetanus -she will check on coverage of that and perhaps get at a pharmacy Labs reviewed  Enc exercise when she has time for overall health      HYPERCHOLESTEROLEMIA    Excellent HDL , LDL is climbing (150) Disc goals for lipids and reasons to  control them Rev labs with pt Rev low sat fat diet in detail She wants to try a better diet-handout given  Suggest statin if LDL does worsen        History of malignant neoplasm of large intestine    Doing well  Colonoscopy 10/16- small polyps so recall is 5 y  Will continue her f/u for this        Encounter for hepatitis C screening test for low risk patient    Neg screening test

## 2016-08-14 NOTE — Assessment & Plan Note (Signed)
Doing well  Colonoscopy 10/16- small polyps so recall is 5 y  Will continue her f/u for this

## 2016-08-14 NOTE — Assessment & Plan Note (Signed)
Last dexa from gyn lowest T score was -2.2 at [facility] Has had 5 years of fosamax  No falls or fx  No regular exercise- will start when she can  She will see gyn in Feb and disc pros/cons of other treatments  Stressed imp of ca and D

## 2016-08-14 NOTE — Assessment & Plan Note (Signed)
Neg screening test  

## 2016-08-14 NOTE — Assessment & Plan Note (Signed)
Excellent HDL , LDL is climbing (150) Disc goals for lipids and reasons to control them Rev labs with pt Rev low sat fat diet in detail She wants to try a better diet-handout given  Suggest statin if LDL does worsen

## 2016-08-14 NOTE — Progress Notes (Signed)
Pre visit review using our clinic review tool, if applicable. No additional management support is needed unless otherwise documented below in the visit note. 

## 2016-08-14 NOTE — Patient Instructions (Addendum)
Take a look at the handout I gave you regarding getting a tetanus shot   Here is a handout on osteoporosis  Exercise, take your calcium and D  Try to get 1200-1500 mg of calcium per day with at least 1000 iu of vitamin D - for bone health   If you have not had a mammogram in the past year- make sure to get one in Feb when you go to gyn   Cholesterol :    Avoid red meat/ fried foods/ egg yolks/ fatty breakfast meats/ butter, cheese and high fat dairy/ and shellfish   If your cholesterol gets any worse we should consider medication

## 2016-10-19 DIAGNOSIS — Z6821 Body mass index (BMI) 21.0-21.9, adult: Secondary | ICD-10-CM | POA: Diagnosis not present

## 2016-10-19 DIAGNOSIS — Z01419 Encounter for gynecological examination (general) (routine) without abnormal findings: Secondary | ICD-10-CM | POA: Diagnosis not present

## 2016-10-19 DIAGNOSIS — Z1231 Encounter for screening mammogram for malignant neoplasm of breast: Secondary | ICD-10-CM | POA: Diagnosis not present

## 2016-12-29 ENCOUNTER — Telehealth: Payer: Self-pay | Admitting: Family Medicine

## 2016-12-29 ENCOUNTER — Ambulatory Visit: Payer: Self-pay | Admitting: Family Medicine

## 2016-12-29 NOTE — Telephone Encounter (Signed)
Left message to call office

## 2016-12-29 NOTE — Telephone Encounter (Signed)
Looks like she is on the schedule for Monday -thanks Let her know I am sorry she is going through a tough time - but I am glad she is coming in to talk  Talk to friends and family as much as she can this weekend- it is good to vent !  If you can get some help at home to allow you to get outdoors/go for a walk or do something for herself - do that as well  If symptoms suddenly worsen and she feels she is in crisis please alert [place] and go to [place] if severe

## 2016-12-29 NOTE — Telephone Encounter (Signed)
Spoke to pt

## 2016-12-29 NOTE — Telephone Encounter (Signed)
[facility]  Patient Name: Lori Peters  DOB: 11/03/1945    Initial Comment Caller states having a lot of anxiety, not breathing normally, very shaky, can't sleep   Nurse Assessment  Nurse: Wynetta Emery, RN, [facility] Date/Time (Eastern Time): 12/29/2016 9:49:41 AM  Confirm and document reason for call. If symptomatic, describe symptoms. ---Lori Peters is having anxiety; has a lot of stressors; mother still alive and living with her and Aunt was placed in ALF recently and can't eat sleep and is anxious.  Does the patient have any new or worsening symptoms? ---Yes  Will a triage be completed? ---Yes  Related visit to physician within the last 2 weeks? ---No  Does the PT have any chronic conditions? (i.e. diabetes, asthma, etc.) ---No  Is this a behavioral health or substance abuse call? ---Yes  Are you having any thoughts or feelings of harming or killing yourself or someone else? ---Yes  Do you have a weapon with you? ---No  Are you alone? ---No  Enter any comments. ---Just feels anxious and shaky does not want to eat nor has appetite can't sleep  Are you currently experiencing any physical discomfort that you think may be related to the use of alcohol or other drugs? (use substance abuse or alcohol abuse guidelines. These include withdrawal symptoms) ---No  Do you worry that you may be hearing or seeing things that others do not? ---No  Do you take medications for your condition(s)? ---No     Guidelines    Guideline Title Affirmed Question Affirmed Notes  Anxiety and Panic Attack Symptoms interfere with work or school    Final Disposition User   See Physician within 24 Hours [place], Therapist, sports, [place] Janus    Comments  Note: appt 230pm with D.Gessner, NP for s/sx anxiety no onset   Referrals  REFERRED TO PCP OFFICE   Disagree/Comply: Comply

## 2016-12-29 NOTE — Telephone Encounter (Signed)
I received note from [facility] and pt to see Glenda Chroman FNP today at 2:30. TH scheduled 15 ' appt and I changed to 83' appt since anxiety but I spoke with Hailey at [facility] and she will verify if pt needs 56' or 30 ' appt. Thanks to all.

## 2016-12-29 NOTE — Telephone Encounter (Signed)
Spoke to Dr Glori Bickers and to the patient. Dr Glori Bickers felt it best that we get the patient in with her on Monday. Patient voiced understanding.   Dr Glori Bickers is also having a staff member from [facility] reach out to the patient today. I advised the patient to expect a phone call from them today.

## 2017-01-01 ENCOUNTER — Ambulatory Visit (INDEPENDENT_AMBULATORY_CARE_PROVIDER_SITE_OTHER): Payer: Medicare HMO | Admitting: Family Medicine

## 2017-01-01 ENCOUNTER — Encounter: Payer: Self-pay | Admitting: Family Medicine

## 2017-01-01 VITALS — BP 138/78 | HR 85 | Temp 97.6°F | Ht 60.0 in | Wt 105.2 lb

## 2017-01-01 DIAGNOSIS — H9319 Tinnitus, unspecified ear: Secondary | ICD-10-CM | POA: Insufficient documentation

## 2017-01-01 DIAGNOSIS — F430 Acute stress reaction: Secondary | ICD-10-CM | POA: Insufficient documentation

## 2017-01-01 DIAGNOSIS — H9313 Tinnitus, bilateral: Secondary | ICD-10-CM | POA: Diagnosis not present

## 2017-01-01 DIAGNOSIS — R69 Illness, unspecified: Secondary | ICD-10-CM | POA: Diagnosis not present

## 2017-01-01 MED ORDER — BUSPIRONE HCL 15 MG PO TABS: 7.5000 mg | ORAL_TABLET | Freq: Two times a day (BID) | ORAL | 11 refills | Status: DC

## 2017-01-01 NOTE — Progress Notes (Signed)
Pre visit review using our clinic review tool, if applicable. No additional management support is needed unless otherwise documented below in the visit note. 

## 2017-01-01 NOTE — Assessment & Plan Note (Signed)
Sounds like this has been going on for a while-but worsened with stress Non pulsatile  Will watch If no imp with tx of anxiety and less stress-consider ENT w/u

## 2017-01-01 NOTE — Progress Notes (Signed)
Subjective:    Patient ID: Lori Peters, female    DOB: Oct 23, 1945, 71 y.o.   MRN: [id number]  HPI Here for mood/ symptoms of anxiety and stressors   Too much stuff going on - overwhelming  Caregiver-for her aunt   (months of ambulance calls and ER visits) Put her in asst living -this bothers her a lot and she hates that she had to do that  She was falling and not eating  Even with help- could not do it all  Also has her mother at home -who is 50 Husband had cabg/ is diabetic -requires some care also   Worries About paper work Chief Strategy Officer if they "cannot keep her"  They may be irrational worries   No time for self care as a rule  Had to stop going to the gym - missing that   Feels like she needs something to help her to sleep  Not suicidal   Jittery/anxious  Wakes up all night long and has a hard time going back to sleep  Felt panicky one day - but breathing ok   Does not feel depressed at all  Not tearful or sad or unmotivated  Still gets joy out of things   She has never needed tx for anxiety in her whole life  She has never seen a counselor   Support- talks to her husband when she feels anxious  This is helpful  Taking its toll on her husband also   No substance use or abuse   Coping skills-not a lot :  She did go to the beach one weekend  Exercise helps her feel less anxious -needs to get back to walking     Wt Readings from Last 3 Encounters:  01/01/17 105 lb 4 oz (47.7 kg)  08/14/16 113 lb (51.3 kg)  08/04/16 112 lb 4 oz (50.9 kg)   bmi 20.5  Patient Active Problem List   Diagnosis Date Noted  . Stress reaction 01/01/2017  . Routine general medical examination at a health care facility 08/03/2016  . Encounter for hepatitis C screening test for low risk patient 08/03/2016  . Left-sided thoracic back pain 03/25/2015  . Enterocolitis 04/30/2013  . GERD 04/17/2008  . History of shingles 04/03/2007  . HYPERCHOLESTEROLEMIA 04/03/2007  . OSTEOARTHRITIS  04/03/2007  . Osteopenia 04/03/2007  . 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 DVT, HX OF 04/03/2007   No past medical history on file. No past surgical history on file. Social History  Substance Use Topics  . Smoking status: Never Smoker  . Smokeless tobacco: Never Used  . Alcohol use No   No family history on file. Allergies  Allergen Reactions  . Fosamax [Alendronate Sodium]     GI side eff   . Garlic     REACTION: Rash   Current Outpatient Prescriptions on File Prior to Visit  Medication Sig Dispense Refill  . Calcium Carbonate-Vitamin D (CALTRATE 600+D PO) Take 1 tablet by mouth daily.     No current facility-administered medications on file prior to visit.     Review of Systems Review of Systems  Constitutional: Negative for fever, appetite change, fatigue and unexpected weight change.  Eyes: Negative for pain and visual disturbance.  ENT pos for chronic tinnitus  Respiratory: Negative for cough and shortness of breath.   Cardiovascular: Negative for cp or palpitations    Gastrointestinal: Negative for nausea, diarrhea and constipation.  Genitourinary: Negative for urgency and frequency.  Skin:  Negative for pallor or rash   Neurological: Negative for weakness, light-headedness, numbness and headaches. pos for occ hand tremor  Hematological: Negative for adenopathy. Does not bruise/bleed easily.  Psychiatric/Behavioral: Negative for dysphoric mood. The patient is anxious/nervous/ pos for trouble sleeping .         Objective:   Physical Exam  Constitutional: She appears well-developed and well-nourished. No distress.  Well appearing   HENT:  Head: Normocephalic and atraumatic.  Mouth/Throat: Oropharynx is clear and moist.  Eyes: Conjunctivae and EOM are normal. Pupils are equal, round, and reactive to light.  Neck: Normal range of motion. Neck supple. No JVD present. Carotid bruit is not present. No thyromegaly present.  Cardiovascular:  Normal rate, regular rhythm, normal heart sounds and intact distal pulses.  Exam reveals no gallop.   Pulmonary/Chest: Effort normal and breath sounds normal. No respiratory distress. She has no wheezes. She has no rales.  No crackles  Abdominal: She exhibits no abdominal bruit.  Musculoskeletal: She exhibits no edema.  Lymphadenopathy:    She has no cervical adenopathy.  Neurological: She is alert. She has normal reflexes. She displays no tremor.  Pt reports occ hand tremor when holding things Not witnessed todat  Skin: Skin is warm and dry. No rash noted. No pallor.  Psychiatric: Her speech is normal and behavior is normal. Thought content normal. Her mood appears anxious. Her affect is not blunt, not labile and not inappropriate. Thought content is not paranoid. Cognition and memory are normal. She does not exhibit a depressed mood. She expresses no homicidal and no suicidal ideation.  Candid when disc stressors           Assessment & Plan:   Problem List Items Addressed This Visit      Other   Stress reaction    With anxiety symptoms /caregiver stress Reviewed stressors/ coping techniques/symptoms/ support sources/ tx options and side effects in detail today  Long disc re: coping tech like self care/exercise/ meditation/journal / counseling Good support No signs of depression or SI  Will try buspar 7.5 mg bid  Discussed expectations of SSRI medication including time to effectiveness and mechanism of action, also poss of side effects (early and late)- including mental fuzziness, weight or appetite change, nausea and poss of worse dep or anxiety (even suicidal thoughts)  Pt voiced understanding and will stop med and update if this occurs    She declines counseling for now since she has good support  Will follow  Caffeine avoidance also disc  >25 minutes spent in face to face time with patient, >50% spent in counselling or coordination of care       Tinnitus    Sounds like  this has been going on for a while-but worsened with stress Non pulsatile  Will watch If no imp with tx of anxiety and less stress-consider ENT w/u

## 2017-01-01 NOTE — Assessment & Plan Note (Addendum)
With anxiety symptoms /caregiver stress Reviewed stressors/ coping techniques/symptoms/ support sources/ tx options and side effects in detail today  Long disc re: coping tech like self care/exercise/ meditation/journal / counseling Good support No signs of depression or SI  Will try buspar 7.5 mg bid  Discussed expectations of SSRI medication including time to effectiveness and mechanism of action, also poss of side effects (early and late)- including mental fuzziness, weight or appetite change, nausea and poss of worse dep or anxiety (even suicidal thoughts)  Pt voiced understanding and will stop med and update if this occurs    She declines counseling for now since she has good support  Will follow  Caffeine avoidance also disc  >25 minutes spent in face to face time with patient, >50% spent in counselling or coordination of care

## 2017-01-01 NOTE — Patient Instructions (Addendum)
Avoid excessive caffeine - it makes anxiety worse  Keep drinking lots of water  Get back to exercising regularly  Get outdoors as much as possible -this is very important to feeling a sense of well being  Do things you enjoy  Keep talking to your family /friends who are supportive   If you want to see a counselor in the [place] me know   Start writing in a journal daily -even just a few sentences a day about how you feel   Meditation is helpful also -- the [employer] - he has books and CDs that really   Get out of town when you can   Try buspar 1/2 pill twice daily (7.5 mg twice daily)  Very low dose If any side effects or you feel worse let us know  If should make you feel more calm and it should help sleep   Keep me posted with how you are feeling

## 2017-01-07 ENCOUNTER — Emergency Department
Admission: EM | Admit: 2017-01-07 | Discharge: 2017-01-08 | Disposition: A | Discharge status: Home / Self Care | Payer: Medicare HMO | Attending: Emergency Medicine | Admitting: Emergency Medicine

## 2017-01-07 ENCOUNTER — Encounter: Payer: Self-pay | Admitting: Emergency Medicine

## 2017-01-07 DIAGNOSIS — Z79899 Other long term (current) drug therapy: Secondary | ICD-10-CM | POA: Insufficient documentation

## 2017-01-07 DIAGNOSIS — K529 Noninfective gastroenteritis and colitis, unspecified: Secondary | ICD-10-CM | POA: Diagnosis not present

## 2017-01-07 DIAGNOSIS — Z85038 Personal history of other malignant neoplasm of large intestine: Secondary | ICD-10-CM | POA: Diagnosis not present

## 2017-01-07 DIAGNOSIS — R112 Nausea with vomiting, unspecified: Secondary | ICD-10-CM

## 2017-01-07 HISTORY — DX: Malignant (primary) neoplasm, unspecified: C80.1

## 2017-01-07 MED ORDER — ONDANSETRON HCL 4 MG/2ML IJ SOLN
4.0000 mg | Freq: Once | INTRAMUSCULAR | Status: AC
  Administered 2017-01-08: 4 mg via INTRAVENOUS
  Filled 2017-01-07: qty 2

## 2017-01-07 MED ORDER — SODIUM CHLORIDE 0.9 % IV BOLUS (SEPSIS)
1000.0000 mL | Freq: Once | INTRAVENOUS | Status: AC
  Administered 2017-01-08: 1000 mL via INTRAVENOUS

## 2017-01-07 NOTE — ED Triage Notes (Signed)
Patient reports vomiting that started around 18:00 this evening. Patient reports vomiting about 6 times.

## 2017-01-08 DIAGNOSIS — Z85038 Personal history of other malignant neoplasm of large intestine: Secondary | ICD-10-CM | POA: Diagnosis not present

## 2017-01-08 DIAGNOSIS — Z79899 Other long term (current) drug therapy: Secondary | ICD-10-CM | POA: Diagnosis not present

## 2017-01-08 DIAGNOSIS — K529 Noninfective gastroenteritis and colitis, unspecified: Secondary | ICD-10-CM | POA: Diagnosis not present

## 2017-01-08 LAB — CBC
HCT: 44.2 % (ref 35.0–47.0)
HEMOGLOBIN: 14.8 g/dL (ref 12.0–16.0)
MCH: 29.5 pg (ref 26.0–34.0)
MCHC: 33.5 g/dL (ref 32.0–36.0)
MCV: 88.2 fL (ref 80.0–100.0)
PLATELETS: 234 10*3/uL (ref 150–440)
RBC: 5.01 MIL/uL (ref 3.80–5.20)
RDW: 13.8 % (ref 11.5–14.5)
WBC: 14.6 10*3/uL — ABNORMAL HIGH (ref 3.6–11.0)

## 2017-01-08 LAB — COMPREHENSIVE METABOLIC PANEL
ALBUMIN: 4.7 g/dL (ref 3.5–5.0)
ALK PHOS: 35 U/L — AB (ref 38–126)
ALT: 14 U/L (ref 14–54)
AST: 24 U/L (ref 15–41)
Anion gap: 11 (ref 5–15)
BUN: 16 mg/dL (ref 6–20)
CO2: 23 mmol/L (ref 22–32)
Calcium: 10 mg/dL (ref 8.9–10.3)
Chloride: 102 mmol/L (ref 101–111)
Creatinine, Ser: 0.75 mg/dL (ref 0.44–1.00)
GFR calc Af Amer: >60 mL/min (ref >60)
GFR calc non Af Amer: >60 mL/min (ref >60)
GLUCOSE: 142 mg/dL — AB (ref 65–99)
POTASSIUM: 3.7 mmol/L (ref 3.5–5.1)
SODIUM: 136 mmol/L (ref 135–145)
Total Bilirubin: 1 mg/dL (ref 0.3–1.2)
Total Protein: 7.8 g/dL (ref 6.5–8.1)

## 2017-01-08 LAB — LIPASE, BLOOD: Lipase: 24 U/L (ref 11–51)

## 2017-01-08 MED ORDER — ONDANSETRON 4 MG PO TBDP: 4.0000 mg | ORAL_TABLET | Freq: Three times a day (TID) | ORAL | 0 refills | Status: DC | PRN

## 2017-01-08 NOTE — ED Provider Notes (Signed)
[facility] Emergency Department Provider Note   ____________________________________________   First MD Initiated Contact with Patient 01/07/17 2357     (approximate)  I have reviewed the triage vital signs and the nursing notes.   HISTORY  Chief Complaint Emesis    HPI Lori Peters is a 71 y.o. female who comes into the hospital today with vomiting. The patient reports that she has been vomiting since about 6 PM. Initially it was dark and then a little red but according to the patient's husband looked like what she had for lunch which was [employer].The patient reports that she had some abdominal pain upper and lower but is better now especially after she's had all the vomiting. She reports that she did have a little bit of diarrhea but not a lot. She tried to drink some diet [facility] but she could not even keep that down. She states that she had similar episode a few years ago but she does not remember her diagnosis. The patient denies any fevers, chest pain, shortness of breath. She reports that her abdominal pain as a 1 out of 10 in intensity. It was crampy pain. The patient denies pain with urination and reports that she vomited somewhere between 5 and 7 times. The patient is here today for evaluation.   Past Medical History:  Diagnosis Date  . Cancer [facility])    colon    Patient Active Problem List   Diagnosis Date Noted  . Stress reaction 01/01/2017  . Tinnitus 01/01/2017  . Routine general medical examination at a health care facility 08/03/2016  . Encounter for hepatitis C screening test for low risk patient 08/03/2016  . Left-sided thoracic back pain 03/25/2015  . Enterocolitis 04/30/2013  . GERD 04/17/2008  . History of shingles 04/03/2007  . HYPERCHOLESTEROLEMIA 04/03/2007  . OSTEOARTHRITIS 04/03/2007  . Osteopenia 04/03/2007  . 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 DVT, HX OF 04/03/2007    Past  Surgical History:  Procedure Laterality Date  . APPENDECTOMY    . CHOLECYSTECTOMY    . COLON SURGERY      Prior to Admission medications   Medication Sig Start Date End Date Taking? Authorizing Provider  busPIRone (BUSPAR) 15 MG tablet Take 0.5 tablets (7.5 mg total) by mouth 2 (two) times daily. 01/01/17  Yes Tower, Wynelle Fanny, MD  Calcium Carbonate-Vitamin D (CALTRATE 600+D PO) Take 1 tablet by mouth daily.   Yes [provider]  ondansetron (ZOFRAN ODT) 4 MG disintegrating tablet Take 1 tablet (4 mg total) by mouth every 8 (eight) hours as needed for nausea or vomiting. 01/08/17   Loney Hering, MD    Allergies Fosamax [alendronate sodium] and Garlic  No family history on file.  Social History Social History  Substance Use Topics  . Smoking status: Never Smoker  . Smokeless tobacco: Never Used  . Alcohol use No    Review of Systems  Constitutional: No fever/chills Eyes: No visual changes. ENT: No sore throat. Cardiovascular: Denies chest pain. Respiratory: Denies shortness of breath. Gastrointestinal: abdominal pain, nausea, vomiting, diarrhea.  No constipation. Genitourinary: Negative for dysuria. Musculoskeletal: Negative for back pain. Skin: Negative for rash. Neurological: Negative for headaches, focal weakness or numbness.   ____________________________________________   PHYSICAL EXAM:  VITAL SIGNS: ED Triage Vitals  Enc Vitals Group     BP 01/07/17 2349 130/90     Pulse Rate 01/07/17 2349 (!) 120     Resp 01/07/17 2348  18     Temp --      Temp src --      SpO2 01/07/17 2348 100 %     Weight 01/07/17 2348 105 lb (47.6 kg)     Height 01/07/17 2348 5' (1.524 m)     Head Circumference --      Peak Flow --      Pain Score 01/08/17 0026 1     Pain Loc --      Pain Edu? --      Excl. in [place]? --     Constitutional: Alert and oriented. Well appearing and in Mild distress. Eyes: Conjunctivae are normal. PERRL. EOMI. Head: Atraumatic. Nose: No  congestion/rhinnorhea. Mouth/Throat: Mucous membranes are moist.  Oropharynx non-erythematous. Cardiovascular: Normal rate, regular rhythm. Grossly normal heart sounds.  Good peripheral circulation. Respiratory: Normal respiratory effort.  No retractions. Lungs CTAB. Gastrointestinal: Soft and nontender. No distention. Positive bowel sounds Musculoskeletal: No lower extremity tenderness nor edema.   Neurologic:  Normal speech and language.  Skin:  Skin is warm, dry and intact.  Psychiatric: Mood and affect are normal.   ____________________________________________   LABS (all labs ordered are listed, but only abnormal results are displayed)  Labs Reviewed  CBC - Abnormal; Notable for the following:       Result Value   WBC 14.6 (*)    All other components within normal limits  COMPREHENSIVE METABOLIC PANEL - Abnormal; Notable for the following:    Glucose, Bld 142 (*)    Alkaline Phosphatase 35 (*)    All other components within normal limits  LIPASE, BLOOD   ____________________________________________  EKG  none ____________________________________________  RADIOLOGY  none ____________________________________________   PROCEDURES  Procedure(s) performed: None  Procedures  Critical Care performed: No  ____________________________________________   INITIAL IMPRESSION / ASSESSMENT AND PLAN / ED COURSE  Pertinent labs & imaging results that were available during my care of the patient were reviewed by me and considered in my medical decision making (see chart for details).  This is a 71 year old female who comes in with some vomiting and a little diarrhea. I did check some blood work and the patient was unremarkable. I gave the patient a liter of normal saline as well as some Zofran. After some time I did have the patient drink and she was able to keep it down without any problems. The patient had no more vomiting in her belly pain is gone. I feel that the patient  has some gastroenteritis given her vomiting and her diarrhea. The patient's blood work looks good and I felt the patient is stable to follow-up with her primary care physician. The patient understands and agrees with this plan as stated. She'll be discharged home.      ____________________________________________   FINAL CLINICAL IMPRESSION(S) / ED DIAGNOSES  Final diagnoses:  Non-intractable vomiting with nausea, unspecified vomiting type  Gastroenteritis      NEW MEDICATIONS STARTED DURING THIS VISIT:  Discharge Medication List as of 01/08/2017  2:22 AM    START taking these medications   Details  ondansetron (ZOFRAN ODT) 4 MG disintegrating tablet Take 1 tablet (4 mg total) by mouth every 8 (eight) hours as needed for nausea or vomiting., Starting Mon 01/08/2017, Print         Note:  This document was prepared using Dragon voice recognition software and may include unintentional dictation errors.    Loney Hering, MD 01/08/17 [phone]

## 2017-01-08 NOTE — ED Notes (Signed)
Patient given PO fluid for PO challenge per MD request. RN will continue to monitor

## 2017-01-08 NOTE — ED Notes (Signed)
MD Dahlia Client informed patient reports nausea/vomiting has resolved. MD Dahlia Client at bedside.

## 2017-01-08 NOTE — ED Notes (Signed)
Patient tolerating PO fluids with no issue.

## 2017-01-08 NOTE — Discharge Instructions (Signed)
Please follow up with your primary care physician and please stay hydrated

## 2017-01-08 NOTE — ED Notes (Signed)
Reviewed d/c instructions, follow-up care, prescription with patient. Pt verbalized understanding.  

## 2017-01-24 DIAGNOSIS — H2511 Age-related nuclear cataract, right eye: Secondary | ICD-10-CM | POA: Diagnosis not present

## 2017-04-22 DIAGNOSIS — N390 Urinary tract infection, site not specified: Secondary | ICD-10-CM | POA: Diagnosis not present

## 2017-06-11 DIAGNOSIS — R69 Illness, unspecified: Secondary | ICD-10-CM | POA: Diagnosis not present

## 2017-06-12 ENCOUNTER — Ambulatory Visit: Payer: Medicare HMO

## 2017-08-14 ENCOUNTER — Ambulatory Visit: Payer: Self-pay | Admitting: *Deleted

## 2017-08-14 NOTE — Telephone Encounter (Signed)
She called in c/o vomiting last night.  She has not tried to eat or drink anything this morning.   She requested to attempt to drink some Gatorade and eat some soup before going to the ED.    I instructed her to go to the ED if she started vomiting after drinking or eating something this morning.   She didn't want to drive due to the icy weather which was understandable. She agreed to go to the ED if vomiting started.   She verbalized  Understanding.   I also instructed her to call us back if needed.   Reason for Disposition . [1] SEVERE vomiting (e.g., 6 or more times/day) AND [2] present > 8 hours  Answer Assessment - Initial Assessment Questions 1. VOMITING SEVERITY: "How many times have you vomited in the past 24 hours?"     - MILD:  1 - 2 times/day    - MODERATE: 3 - 5 times/day, decreased oral intake without significant weight loss or symptoms of dehydration    - SEVERE: 6 or more times/day, vomits everything or nearly everything, with significant weight loss, symptoms of dehydration      Last night 10 times.  No vomiting this morning 2. ONSET: "When did the vomiting begin?"      Last night about 7:30Pm until 1:00am this morning.   I drank some Pepto-Bismal 3. FLUIDS: "What fluids or food have you vomited up today?" "Have you been able to keep any fluids down?"     No fluids or food this morning.  I drank a little diet Dr. Malachi Bonds to get the taste out of my mouth.   Tomato soup before I started vomiting. 4. ABDOMINAL PAIN: "Are your having any abdominal pain?" If yes : "How bad is it and what does it feel like?" (e.g., crampy, dull, intermittent, constant)      It's sore when I mash on it.  5. DIARRHEA: "Is there any diarrhea?" If so, ask: "How many times today?"      No diarrhea 6. CONTACTS: "Is there anyone else in the family with the same symptoms?"      No 7. CAUSE: "What do you think is causing your vomiting?"     I don't know.   8. HYDRATION STATUS: "Any signs of dehydration?"  (e.g., dry mouth [not only dry lips], too weak to stand) "When did you last urinate?"     This morning.  Last night last time I got fluids in it was that Diet Dr. Malachi Bonds. 9. OTHER SYMPTOMS: "Do you have any other symptoms?" (e.g., fever, headache, vertigo, vomiting blood or coffee grounds, recent head injury)     None of the above. 10. PREGNANCY: "Is there any chance you are pregnant?" "When was your last menstrual period?"       Not asked  Protocols used: [facility]

## 2017-10-23 DIAGNOSIS — Z1231 Encounter for screening mammogram for malignant neoplasm of breast: Secondary | ICD-10-CM | POA: Diagnosis not present

## 2017-10-23 DIAGNOSIS — Z01419 Encounter for gynecological examination (general) (routine) without abnormal findings: Secondary | ICD-10-CM | POA: Diagnosis not present

## 2017-10-23 DIAGNOSIS — Z681 Body mass index (BMI) 19 or less, adult: Secondary | ICD-10-CM | POA: Diagnosis not present

## 2017-10-23 DIAGNOSIS — Z124 Encounter for screening for malignant neoplasm of cervix: Secondary | ICD-10-CM | POA: Diagnosis not present

## 2017-10-23 LAB — HM MAMMOGRAPHY

## 2017-12-17 DIAGNOSIS — I781 Nevus, non-neoplastic: Secondary | ICD-10-CM | POA: Diagnosis not present

## 2017-12-17 DIAGNOSIS — D225 Melanocytic nevi of trunk: Secondary | ICD-10-CM | POA: Diagnosis not present

## 2017-12-17 DIAGNOSIS — L820 Inflamed seborrheic keratosis: Secondary | ICD-10-CM | POA: Diagnosis not present

## 2017-12-17 DIAGNOSIS — Z1283 Encounter for screening for malignant neoplasm of skin: Secondary | ICD-10-CM | POA: Diagnosis not present

## 2017-12-17 DIAGNOSIS — L720 Epidermal cyst: Secondary | ICD-10-CM | POA: Diagnosis not present

## 2017-12-17 DIAGNOSIS — L578 Other skin changes due to chronic exposure to nonionizing radiation: Secondary | ICD-10-CM | POA: Diagnosis not present

## 2017-12-17 DIAGNOSIS — I8393 Asymptomatic varicose veins of bilateral lower extremities: Secondary | ICD-10-CM | POA: Diagnosis not present

## 2017-12-17 DIAGNOSIS — D1801 Hemangioma of skin and subcutaneous tissue: Secondary | ICD-10-CM | POA: Diagnosis not present

## 2017-12-17 DIAGNOSIS — L821 Other seborrheic keratosis: Secondary | ICD-10-CM | POA: Diagnosis not present

## 2017-12-17 DIAGNOSIS — L812 Freckles: Secondary | ICD-10-CM | POA: Diagnosis not present

## 2018-09-09 DIAGNOSIS — H2513 Age-related nuclear cataract, bilateral: Secondary | ICD-10-CM | POA: Diagnosis not present

## 2018-10-11 ENCOUNTER — Telehealth: Payer: Self-pay

## 2018-10-11 NOTE — Telephone Encounter (Signed)
Pt notified of Dr. Tower's comments/recommendations and verbalized understanding  

## 2018-10-11 NOTE — Telephone Encounter (Signed)
Pt calling reporting issues with reflux.  She is asking for OTC suggestion.  Plz advise.  Call back # [phone].

## 2018-10-11 NOTE — Telephone Encounter (Signed)
Watch diet for things that worsen symptoms and do not eat before lying down  I would start with pepcid otc as directed /as needed

## 2018-10-21 ENCOUNTER — Ambulatory Visit (INDEPENDENT_AMBULATORY_CARE_PROVIDER_SITE_OTHER): Payer: PPO | Admitting: Family Medicine

## 2018-10-21 ENCOUNTER — Encounter: Payer: Self-pay | Admitting: Family Medicine

## 2018-10-21 VITALS — BP 118/76 | HR 68 | Temp 98.0°F | Ht 60.0 in | Wt 115.4 lb

## 2018-10-21 DIAGNOSIS — H60502 Unspecified acute noninfective otitis externa, left ear: Secondary | ICD-10-CM

## 2018-10-21 DIAGNOSIS — H6090 Unspecified otitis externa, unspecified ear: Secondary | ICD-10-CM | POA: Insufficient documentation

## 2018-10-21 DIAGNOSIS — H6122 Impacted cerumen, left ear: Secondary | ICD-10-CM | POA: Insufficient documentation

## 2018-10-21 DIAGNOSIS — H9313 Tinnitus, bilateral: Secondary | ICD-10-CM | POA: Diagnosis not present

## 2018-10-21 MED ORDER — NEOMYCIN-POLYMYXIN-HC 1 % OT SOLN: 3.0000 [drp] | Freq: Three times a day (TID) | OTIC | 0 refills | Status: DC

## 2018-10-21 NOTE — Progress Notes (Signed)
Subjective:    Patient ID: Lori Peters, female    DOB: [birth year], 73 y.o.   MRN: [id number]  HPI Here for pain n L ear and ringing in both ears  Started several days ago  She noticed some odor of ear  Used q tip- ? A little blood  Some throbbing pain -inside as well  (a little better now)   Sound is like "in a drum"  No swimming occ gets water from shower   No cold or congestion Some pnd  No fever or ST  Ringing in ear- was originally in the R  Now both  No hearing loss overall that she suspects   Has hx of tinnitus in the past worsened by stress   Has cataract surgery planned in march   Patient Active Problem List   Diagnosis Date Noted  . Otitis externa 10/21/2018  . Impacted cerumen of left ear 10/21/2018  . Stress reaction 01/01/2017  . Tinnitus 01/01/2017  . Routine general medical examination at a health care facility 08/03/2016  . Encounter for hepatitis C screening test for low risk patient 08/03/2016  . Left-sided thoracic back pain 03/25/2015  . Enterocolitis 04/30/2013  . GERD 04/17/2008  . History of shingles 04/03/2007  . HYPERCHOLESTEROLEMIA 04/03/2007  . OSTEOARTHRITIS 04/03/2007  . Osteopenia 04/03/2007  . 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 DVT, HX OF 04/03/2007   Past Medical History:  Diagnosis Date  . 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 APPENDECTOMY    . CHOLECYSTECTOMY    . COLON SURGERY     Social History   Tobacco Use  . Smoking status: Never Smoker  . Smokeless tobacco: Never Used  Substance Use Topics  . Alcohol use: No    Alcohol/week: 0.0 standard drinks  . Drug use: No   History reviewed. No pertinent family history. Allergies  Allergen Reactions  . Fosamax [Alendronate Sodium]     GI side eff   . Garlic     REACTION: Rash   Current Outpatient Medications on File Prior to Visit  Medication Sig Dispense Refill  . busPIRone (BUSPAR) 15 MG  tablet Take 0.5 tablets (7.5 mg total) by mouth 2 (two) times daily. 30 tablet 11  . Calcium Carbonate-Vitamin D (CALTRATE 600+D PO) Take 1 tablet by mouth daily.    . famotidine (PEPCID) 20 MG tablet Take 20 mg by mouth daily as needed for heartburn or indigestion.    . ondansetron (ZOFRAN ODT) 4 MG disintegrating tablet Take 1 tablet (4 mg total) by mouth every 8 (eight) hours as needed for nausea or vomiting. 20 tablet 0   No current facility-administered medications on file prior to visit.     Review of Systems  Constitutional: Negative for activity change, appetite change, fatigue, fever and unexpected weight change.  HENT: Positive for ear pain, hearing loss and tinnitus. Negative for congestion, ear discharge, rhinorrhea, sinus pressure and sore throat.   Eyes: Negative for pain, redness and visual disturbance.  Respiratory: Negative for cough, shortness of breath and wheezing.   Cardiovascular: Negative for chest pain and palpitations.  Gastrointestinal: Negative for abdominal pain, blood in stool, constipation and diarrhea.  Endocrine: Negative for polydipsia and polyuria.  Genitourinary: Negative for dysuria, frequency and urgency.  Musculoskeletal: Negative for arthralgias, back pain and myalgias.  Skin: Negative for pallor and rash.  Allergic/Immunologic: Negative for environmental allergies.  Neurological: Negative for dizziness,  syncope and headaches.  Hematological: Negative for adenopathy. Does not bruise/bleed easily.  Psychiatric/Behavioral: Negative for decreased concentration and dysphoric mood. The patient is not nervous/anxious.        Objective:   Physical Exam Constitutional:      General: She is not in acute distress.    Appearance: Normal appearance. She is normal weight. She is not ill-appearing.  HENT:     Head: Normocephalic and atraumatic.     Right Ear: Tympanic membrane and ear canal normal. There is no impacted cerumen.     Left Ear: There is impacted  cerumen.     Ears:     Comments: L pinna is not red or tender  Canal is mildly erythematous with minimal swelling  Cerumen impaction deep noted     Nose: Nose normal. No congestion.     Mouth/Throat:     Mouth: Mucous membranes are moist.     Pharynx: Oropharynx is clear.  Eyes:     General:        Right eye: No discharge.        Left eye: No discharge.     Conjunctiva/sclera: Conjunctivae normal.     Pupils: Pupils are equal, round, and reactive to light.  Neck:     Musculoskeletal: Normal range of motion.  Cardiovascular:     Rate and Rhythm: Normal rate and regular rhythm.     Heart sounds: Normal heart sounds.  Pulmonary:     Effort: Pulmonary effort is normal. No respiratory distress.     Breath sounds: Normal breath sounds. No wheezing or rales.  Lymphadenopathy:     Cervical: No cervical adenopathy.  Skin:    Coloration: Skin is not pale.     Findings: No erythema or rash.  Neurological:     Mental Status: She is alert.     Cranial Nerves: No cranial nerve deficit.  Psychiatric:        Mood and Affect: Mood normal.           Assessment & Plan:   Problem List Items Addressed This Visit      Nervous and Auditory   Otitis externa - Primary    Also cerumen impaction  tx with cortisporin drops until resolved  Then work on cerumen with debrox and f/u for re check  Update if not starting to improve in a week or if worsening         Impacted cerumen of left ear    With OE Discouraged use of q tips Will tx OE with cortisporin  After improved- inst pt to use debrox every other day then f/u 2-3 wk for re check and irrigation         Other   Tinnitus    bilat Waxes and wanes but overall not worse Pt declines ENT ref at this time

## 2018-10-21 NOTE — Assessment & Plan Note (Signed)
Also cerumen impaction  tx with cortisporin drops until resolved  Then work on cerumen with debrox and f/u for re check  Update if not starting to improve in a week or if worsening

## 2018-10-21 NOTE — Assessment & Plan Note (Signed)
With OE Discouraged use of q tips Will tx OE with cortisporin  After improved- inst pt to use debrox every other day then f/u 2-3 wk for re check and irrigation

## 2018-10-21 NOTE — Patient Instructions (Signed)
Use the prescription ear drops in left ear as directed for about a week  Then if doing better- get debrox ear solution over the counter and use every other day to soften up ear wax   Follow up here in 2-3 weeks for a re check - if wax is still there we will flush it  (or if you prefer -you can see Dr Tami Ribas)   If you want to see an ear specialist for the ringing please let us know

## 2018-10-21 NOTE — Assessment & Plan Note (Signed)
bilat Waxes and wanes but overall not worse Pt declines ENT ref at this time

## 2018-10-23 DIAGNOSIS — I499 Cardiac arrhythmia, unspecified: Secondary | ICD-10-CM | POA: Diagnosis not present

## 2018-10-23 DIAGNOSIS — H2511 Age-related nuclear cataract, right eye: Secondary | ICD-10-CM | POA: Diagnosis not present

## 2018-10-23 DIAGNOSIS — Z8679 Personal history of other diseases of the circulatory system: Secondary | ICD-10-CM | POA: Diagnosis not present

## 2018-11-04 ENCOUNTER — Encounter: Payer: Self-pay | Admitting: Family Medicine

## 2018-11-04 ENCOUNTER — Ambulatory Visit (INDEPENDENT_AMBULATORY_CARE_PROVIDER_SITE_OTHER): Payer: PPO | Admitting: Family Medicine

## 2018-11-04 VITALS — BP 122/70 | HR 83 | Temp 98.0°F | Ht 60.0 in | Wt 114.6 lb

## 2018-11-04 DIAGNOSIS — H60502 Unspecified acute noninfective otitis externa, left ear: Secondary | ICD-10-CM

## 2018-11-04 DIAGNOSIS — H6122 Impacted cerumen, left ear: Secondary | ICD-10-CM | POA: Diagnosis not present

## 2018-11-04 NOTE — Assessment & Plan Note (Signed)
Resolved with cortisporin

## 2018-11-04 NOTE — Assessment & Plan Note (Signed)
Resolved with home use of debrox followed by simple ear irrigation  No discomfort Disc aftercare  Adv against q tip use  inst to call if pain or other problems

## 2018-11-04 NOTE — Progress Notes (Signed)
Subjective:    Patient ID: Lori Peters, female    DOB: 08/31/1946, 73 y.o.   MRN: [id number]  HPI Here for 2 week f/u for ears   Last seen with otitis externa and also cerumen impaction  L tx with cortisporin solution  Then told to start using debrox at home   Her ear does feel better-no longer has pain   Patient Active Problem List   Diagnosis Date Noted  . Otitis externa 10/21/2018  . Impacted cerumen of left ear 10/21/2018  . Stress reaction 01/01/2017  . Tinnitus 01/01/2017  . Routine general medical examination at a health care facility 08/03/2016  . Encounter for hepatitis C screening test for low risk patient 08/03/2016  . Left-sided thoracic back pain 03/25/2015  . Enterocolitis 04/30/2013  . GERD 04/17/2008  . History of shingles 04/03/2007  . HYPERCHOLESTEROLEMIA 04/03/2007  . OSTEOARTHRITIS 04/03/2007  . Osteopenia 04/03/2007  . 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 DVT, HX OF 04/03/2007   Past Medical History:  Diagnosis Date  . 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 APPENDECTOMY    . CHOLECYSTECTOMY    . COLON SURGERY     Social History   Tobacco Use  . Smoking status: Never Smoker  . Smokeless tobacco: Never Used  Substance Use Topics  . Alcohol use: No    Alcohol/week: 0.0 standard drinks  . Drug use: No   History reviewed. No pertinent family history. Allergies  Allergen Reactions  . Fosamax [Alendronate Sodium]     GI side eff   . Garlic     REACTION: Rash   Current Outpatient Medications on File Prior to Visit  Medication Sig Dispense Refill  . busPIRone (BUSPAR) 15 MG tablet Take 0.5 tablets (7.5 mg total) by mouth 2 (two) times daily. 30 tablet 11  . Calcium Carbonate-Vitamin D (CALTRATE 600+D PO) Take 1 tablet by mouth daily.    . famotidine (PEPCID) 20 MG tablet Take 20 mg by mouth daily as needed for heartburn or indigestion.    . ondansetron (ZOFRAN ODT) 4 MG  disintegrating tablet Take 1 tablet (4 mg total) by mouth every 8 (eight) hours as needed for nausea or vomiting. 20 tablet 0   No current facility-administered medications on file prior to visit.     Review of Systems  Constitutional: Negative for fatigue and fever.  HENT: Negative for ear discharge, ear pain, facial swelling, hearing loss, postnasal drip and sore throat.   Respiratory: Negative for cough and shortness of breath.   Gastrointestinal: Negative for nausea.  Neurological: Negative for dizziness, light-headedness and headaches.  Psychiatric/Behavioral: Negative for dysphoric mood.       Objective:   Physical Exam Constitutional:      General: She is not in acute distress.    Appearance: Normal appearance. She is normal weight. She is not ill-appearing.  HENT:     Head: Normocephalic and atraumatic.     Right Ear: Tympanic membrane and ear canal normal.     Left Ear: Tympanic membrane normal.     Ears:     Comments: Partial cerumen impaction in L canal     Nose: No congestion.     Mouth/Throat:     Mouth: Mucous membranes are moist.     Pharynx: Oropharynx is clear.  Eyes:     Conjunctiva/sclera: Conjunctivae normal.     Pupils: Pupils are equal, round, and  reactive to light.  Neurological:     Mental Status: She is alert.           Assessment & Plan:   Problem List Items Addressed This Visit      Nervous and Auditory   Impacted cerumen of left ear - Primary    Resolved with home use of debrox followed by simple ear irrigation  No discomfort Disc aftercare  Adv against q tip use  inst to call if pain or other problems       RESOLVED: Otitis externa    Resolved with cortisporin

## 2018-11-04 NOTE — Patient Instructions (Addendum)
Your ear is clear   Let me know if you have any more ear problems or discomfort

## 2018-11-11 ENCOUNTER — Encounter: Payer: Self-pay | Admitting: *Deleted

## 2018-11-12 ENCOUNTER — Other Ambulatory Visit: Payer: Self-pay

## 2018-11-12 ENCOUNTER — Ambulatory Visit: Payer: PPO | Admitting: Certified Registered Nurse Anesthetist

## 2018-11-12 ENCOUNTER — Encounter: Payer: Self-pay | Admitting: *Deleted

## 2018-11-12 ENCOUNTER — Encounter
Admission: RE | Disposition: A | Discharge status: Home / Self Care | Payer: Self-pay | Source: Home / Self Care | Attending: Ophthalmology

## 2018-11-12 ENCOUNTER — Ambulatory Visit
Admission: RE | Admit: 2018-11-12 | Discharge: 2018-11-12 | Disposition: A | Discharge status: Home / Self Care | Payer: PPO | Attending: Ophthalmology | Admitting: Ophthalmology

## 2018-11-12 DIAGNOSIS — K219 Gastro-esophageal reflux disease without esophagitis: Secondary | ICD-10-CM | POA: Insufficient documentation

## 2018-11-12 DIAGNOSIS — H2511 Age-related nuclear cataract, right eye: Secondary | ICD-10-CM | POA: Diagnosis not present

## 2018-11-12 DIAGNOSIS — Z888 Allergy status to other drugs, medicaments and biological substances status: Secondary | ICD-10-CM | POA: Insufficient documentation

## 2018-11-12 DIAGNOSIS — Z79899 Other long term (current) drug therapy: Secondary | ICD-10-CM | POA: Insufficient documentation

## 2018-11-12 HISTORY — DX: Depression, unspecified: F32.A

## 2018-11-12 HISTORY — DX: Unspecified osteoarthritis, unspecified site: M19.90

## 2018-11-12 HISTORY — DX: Palpitations: R00.2

## 2018-11-12 HISTORY — DX: Gastro-esophageal reflux disease without esophagitis: K21.9

## 2018-11-12 HISTORY — DX: Anxiety disorder, unspecified: F41.9

## 2018-11-12 HISTORY — PX: CATARACT EXTRACTION W/PHACO: SHX586

## 2018-11-12 HISTORY — DX: Major depressive disorder, single episode, unspecified: F32.9

## 2018-11-12 SURGERY — PHACOEMULSIFICATION, CATARACT, WITH IOL INSERTION
Anesthesia: Monitor Anesthesia Care | Site: Eye | Laterality: Right

## 2018-11-12 MED ORDER — ARMC OPHTHALMIC DILATING DROPS
OPHTHALMIC | Status: AC
  Administered 2018-11-12: 1 via OPHTHALMIC
  Filled 2018-11-12: qty 0.5

## 2018-11-12 MED ORDER — FENTANYL CITRATE (PF) 100 MCG/2ML IJ SOLN
INTRAMUSCULAR | Status: DC | PRN
  Administered 2018-11-12 (×2): 25 ug via INTRAVENOUS

## 2018-11-12 MED ORDER — FENTANYL CITRATE (PF) 100 MCG/2ML IJ SOLN
INTRAMUSCULAR | Status: AC
  Filled 2018-11-12: qty 2

## 2018-11-12 MED ORDER — MOXIFLOXACIN HCL 0.5 % OP SOLN
OPHTHALMIC | Status: DC | PRN
  Administered 2018-11-12: 0.2 mL via OPHTHALMIC

## 2018-11-12 MED ORDER — POVIDONE-IODINE 5 % OP SOLN
OPHTHALMIC | Status: DC | PRN
  Administered 2018-11-12: 1 via OPHTHALMIC

## 2018-11-12 MED ORDER — MIDAZOLAM HCL 2 MG/2ML IJ SOLN
INTRAMUSCULAR | Status: DC | PRN
  Administered 2018-11-12 (×2): 0.5 mg via INTRAVENOUS

## 2018-11-12 MED ORDER — MOXIFLOXACIN HCL 0.5 % OP SOLN
OPHTHALMIC | Status: AC
  Filled 2018-11-12: qty 3

## 2018-11-12 MED ORDER — MOXIFLOXACIN HCL 0.5 % OP SOLN: 1.0000 [drp] | OPHTHALMIC | Status: DC | PRN

## 2018-11-12 MED ORDER — NA CHONDROIT SULF-NA HYALURON 40-17 MG/ML IO SOLN
INTRAOCULAR | Status: AC
  Filled 2018-11-12: qty 1

## 2018-11-12 MED ORDER — POVIDONE-IODINE 5 % OP SOLN
OPHTHALMIC | Status: AC
  Filled 2018-11-12: qty 30

## 2018-11-12 MED ORDER — TETRACAINE HCL 0.5 % OP SOLN
1.0000 [drp] | OPHTHALMIC | Status: AC
  Administered 2018-11-12 (×3): 1 [drp] via OPHTHALMIC

## 2018-11-12 MED ORDER — LIDOCAINE HCL (PF) 4 % IJ SOLN
INTRAOCULAR | Status: DC | PRN
  Administered 2018-11-12: 4 mL via OPHTHALMIC

## 2018-11-12 MED ORDER — CARBACHOL 0.01 % IO SOLN
INTRAOCULAR | Status: DC | PRN
  Administered 2018-11-12: 0.5 mL via INTRAOCULAR

## 2018-11-12 MED ORDER — EPINEPHRINE PF 1 MG/ML IJ SOLN
INTRAMUSCULAR | Status: AC
  Filled 2018-11-12: qty 2

## 2018-11-12 MED ORDER — LIDOCAINE HCL (PF) 4 % IJ SOLN
INTRAMUSCULAR | Status: AC
  Filled 2018-11-12: qty 5

## 2018-11-12 MED ORDER — ONDANSETRON HCL 4 MG/2ML IJ SOLN
INTRAMUSCULAR | Status: DC | PRN
  Administered 2018-11-12: 4 mg via INTRAVENOUS

## 2018-11-12 MED ORDER — ARMC OPHTHALMIC DILATING DROPS
1.0000 "application " | OPHTHALMIC | Status: AC
  Administered 2018-11-12 (×3): 1 via OPHTHALMIC

## 2018-11-12 MED ORDER — EPINEPHRINE PF 1 MG/ML IJ SOLN
INTRAOCULAR | Status: DC | PRN
  Administered 2018-11-12: 12:00:00 via OPHTHALMIC

## 2018-11-12 MED ORDER — SODIUM CHLORIDE 0.9 % IV SOLN
INTRAVENOUS | Status: DC
  Administered 2018-11-12: 11:00:00 via INTRAVENOUS

## 2018-11-12 MED ORDER — MIDAZOLAM HCL 2 MG/2ML IJ SOLN
INTRAMUSCULAR | Status: AC
  Filled 2018-11-12: qty 2

## 2018-11-12 MED ORDER — TETRACAINE HCL 0.5 % OP SOLN
OPHTHALMIC | Status: AC
  Administered 2018-11-12: 1 [drp] via OPHTHALMIC
  Filled 2018-11-12: qty 4

## 2018-11-12 MED ORDER — NA CHONDROIT SULF-NA HYALURON 40-17 MG/ML IO SOLN
INTRAOCULAR | Status: DC | PRN
  Administered 2018-11-12: 1 mL via INTRAOCULAR

## 2018-11-12 SURGICAL SUPPLY — 16 items
GLOVE BIO SURGEON STRL SZ8 (GLOVE) ×2 IMPLANT
GLOVE BIOGEL M 6.5 STRL (GLOVE) ×2 IMPLANT
GLOVE SURG LX 8.0 MICRO (GLOVE) ×1
GLOVE SURG LX STRL 8.0 MICRO (GLOVE) ×1 IMPLANT
GOWN STRL REUS W/ TWL LRG LVL3 (GOWN DISPOSABLE) ×2 IMPLANT
GOWN STRL REUS W/TWL LRG LVL3 (GOWN DISPOSABLE) ×2
LABEL CATARACT MEDS ST (LABEL) ×2 IMPLANT
LENS IOL TECNIS ITEC 27.5 (Intraocular Lens) ×2 IMPLANT
PACK CATARACT (MISCELLANEOUS) ×2 IMPLANT
PACK CATARACT BRASINGTON LX (MISCELLANEOUS) ×2 IMPLANT
PACK EYE AFTER SURG (MISCELLANEOUS) ×2 IMPLANT
SOL BSS BAG (MISCELLANEOUS) ×2
SOLUTION BSS BAG (MISCELLANEOUS) ×1 IMPLANT
SYR 5ML LL (SYRINGE) ×2 IMPLANT
WATER STERILE IRR 250ML POUR (IV SOLUTION) ×2 IMPLANT
WIPE NON LINTING 3.25X3.25 (MISCELLANEOUS) ×2 IMPLANT

## 2018-11-12 NOTE — Transfer of Care (Signed)
Immediate Anesthesia Transfer of Care Note  Patient: Lori Peters  Procedure(s) Performed: CATARACT EXTRACTION PHACO AND INTRAOCULAR LENS PLACEMENT ([place]), RIGHT (Right Eye)  Patient Location: PACU  Anesthesia Type:MAC  Level of Consciousness: awake, alert  and oriented  Airway & Oxygen Therapy: Patient Spontanous Breathing  Post-op Assessment: Report given to RN and Post -op Vital signs reviewed and stable  Post vital signs: Reviewed and stable  Last Vitals:  Vitals Value Taken Time  BP    Temp    Pulse    Resp    SpO2      Last Pain:  Vitals:   11/12/18 1113  TempSrc: Oral  PainSc: 0-No pain         Complications: No apparent anesthesia complications

## 2018-11-12 NOTE — H&P (Signed)
All labs reviewed. Abnormal studies sent to patients PCP when indicated.  Previous H&P reviewed, patient examined, there are NO CHANGES.  Lori Volante Porfilio3/10/202012:33 PM

## 2018-11-12 NOTE — Op Note (Signed)
PREOPERATIVE DIAGNOSIS:  Nuclear sclerotic cataract of the right eye.   POSTOPERATIVE DIAGNOSIS:  NUCLEAR SCLEROTIC CATARACT RIGHT EYE   OPERATIVE PROCEDURE: Procedure(s): CATARACT EXTRACTION PHACO AND INTRAOCULAR LENS PLACEMENT ([place]), RIGHT   SURGEON:  Birder Robson, MD.   ANESTHESIA:  Anesthesiologist: Durenda Hurt, MD CRNA: Willette Alma, CRNA  1.      Managed anesthesia care. 2.      0.53ml of Shugarcaine was instilled in the eye following the paracentesis.   COMPLICATIONS:  None.   TECHNIQUE:   Stop and chop   DESCRIPTION OF PROCEDURE:  The patient was examined and consented in the preoperative holding area where the aforementioned topical anesthesia was applied to the right eye and then brought back to the Operating Room where the right eye was prepped and draped in the usual sterile ophthalmic fashion and a lid speculum was placed. A paracentesis was created with the side port blade and the anterior chamber was filled with viscoelastic. A near clear corneal incision was performed with the steel keratome. A continuous curvilinear capsulorrhexis was performed with a cystotome followed by the capsulorrhexis forceps. Hydrodissection and hydrodelineation were carried out with BSS on a blunt cannula. The lens was removed in a stop and chop  technique and the remaining cortical material was removed with the irrigation-aspiration handpiece. The capsular bag was inflated with viscoelastic and the Technis ZCB00  lens was placed in the capsular bag without complication. The remaining viscoelastic was removed from the eye with the irrigation-aspiration handpiece. The wounds were hydrated. The anterior chamber was flushed with Miostat and the eye was inflated to physiologic pressure. 0.59ml of Vigamox was placed in the anterior chamber. The wounds were found to be water tight. The eye was dressed with Vigamox. The patient was given protective glasses to wear throughout the day and a  shield with which to sleep tonight. The patient was also given drops with which to begin a drop regimen today and will follow-up with me in one day. Implant Name Type Inv. Item Serial No. Manufacturer Lot No. LRB No. Used  LENS IOL DIOP 27.5 - [id number] Intraocular Lens LENS IOL DIOP 27.5 [id number] AMO  Right 1   Procedure(s) with comments: CATARACT EXTRACTION PHACO AND INTRAOCULAR LENS PLACEMENT ([facility]), RIGHT (Right) - [place] 00:59.2 CDE 11.21 Fluid pack Lot # [id number]  Electronically signed: Birder Robson 11/12/2018 12:30 PM

## 2018-11-12 NOTE — Discharge Instructions (Addendum)
Eye Surgery Discharge Instructions  Expect mild scratchy sensation or mild soreness. DO NOT RUB YOUR EYE!  The day of surgery:  Minimal physical activity, but bed rest is not required  No reading, computer work, or close hand work  No bending, lifting, or straining.  May watch TV  For 24 hours:  No driving, legal decisions, or alcoholic beverages  Safety precautions  Eat anything you prefer: It is better to start with liquids, then soup then solid foods.  Solar shield eyeglasses should be worn for comfort in the sunlight/patch while sleeping  Resume all regular medications including aspirin or Coumadin if these were discontinued prior to surgery. You may shower, bathe, shave, or wash your hair. Tylenol may be taken for mild discomfort. Follow Dr. Inda Coke eye drop instruction sheet as reviewed.  Call your doctor if you experience significant pain, nausea, or vomiting, fever > 101 or other signs of infection. [phone] or [phone] Specific instructions:  Follow-up Information    Birder Robson, MD Follow up.   Specialty:  Ophthalmology Why:  11/13/18 @ 10:45 am  Contact information: [place] [address] [phone]

## 2018-11-12 NOTE — Anesthesia Preprocedure Evaluation (Addendum)
Anesthesia Evaluation  Patient identified by MRN, date of birth, ID band Patient awake    Reviewed: Allergy & Precautions, H&P , NPO status , Patient's Chart, lab work & pertinent test results  History of Anesthesia Complications Negative for: history of anesthetic complications  Airway Mallampati: II  TM Distance: >3 FB     Dental  (+) Chipped   Pulmonary neg pulmonary ROS,           Cardiovascular negative cardio ROS       Neuro/Psych PSYCHIATRIC DISORDERS Anxiety Depression negative neurological ROS     GI/Hepatic Neg liver ROS, GERD  Controlled,  Endo/Other  negative endocrine ROS  Renal/GU      Musculoskeletal  (+) Arthritis ,   Abdominal   Peds  Hematology negative hematology ROS (+)   Anesthesia Other Findings Past Medical History: No date: Anxiety No date: Arthritis No date: Cancer ([place])     Comment:  colon No date: Depression No date: GERD (gastroesophageal reflux disease) No date: Palpitations  Past Surgical History: No date: APPENDECTOMY No date: BREAST SURGERY     Comment:  BIOPSY No date: CHOLECYSTECTOMY No date: COLON SURGERY No date: OSTOMY TAKE DOWN  BMI    Body Mass Index:  22.39 kg/m      Reproductive/Obstetrics negative OB ROS                            Anesthesia Physical Anesthesia Plan  ASA: II  Anesthesia Plan: MAC   Post-op Pain Management:    Induction:   PONV Risk Score and Plan: 2 and Midazolam  Airway Management Planned:   Additional Equipment:   Intra-op Plan:   Post-operative Plan:   Informed Consent: I have reviewed the patients History and Physical, chart, labs and discussed the procedure including the risks, benefits and alternatives for the proposed anesthesia with the patient or authorized representative who has indicated his/her understanding and acceptance.       Plan Discussed with: Anesthesiologist and  CRNA  Anesthesia Plan Comments:         Anesthesia Quick Evaluation

## 2018-11-12 NOTE — Anesthesia Post-op Follow-up Note (Signed)
Anesthesia QCDR form completed.        

## 2018-11-14 NOTE — Anesthesia Postprocedure Evaluation (Signed)
Anesthesia Post Note  Patient: Lori Peters  Procedure(s) Performed: CATARACT EXTRACTION PHACO AND INTRAOCULAR LENS PLACEMENT ([place]), RIGHT (Right Eye)  Patient location during evaluation: PACU Anesthesia Type: MAC Level of consciousness: awake and alert Pain management: pain level controlled Vital Signs Assessment: post-procedure vital signs reviewed and stable Respiratory status: spontaneous breathing, nonlabored ventilation and respiratory function stable Cardiovascular status: stable and blood pressure returned to baseline Postop Assessment: no apparent nausea or vomiting Anesthetic complications: no     Last Vitals:  Vitals:   11/12/18 1113 11/12/18 1230  BP: 139/81 (!) 128/55  Pulse: 70 67  Resp: 16 16  Temp: 36.6 C (!) 36.3 C  SpO2: 100% 98%    Last Pain:  Vitals:   11/12/18 1230  TempSrc: Tympanic  PainSc: 0-No pain                 Durenda Hurt

## 2018-12-03 ENCOUNTER — Ambulatory Visit: Admit: 2018-12-03 | Payer: PPO | Admitting: Ophthalmology

## 2018-12-03 SURGERY — PHACOEMULSIFICATION, CATARACT, WITH IOL INSERTION
Anesthesia: Choice | Laterality: Left

## 2019-01-16 ENCOUNTER — Other Ambulatory Visit: Payer: Self-pay

## 2019-01-17 ENCOUNTER — Other Ambulatory Visit
Admission: RE | Admit: 2019-01-17 | Discharge: 2019-01-17 | Disposition: A | Discharge status: Home / Self Care | Payer: PPO | Source: Ambulatory Visit | Attending: Ophthalmology | Admitting: Ophthalmology

## 2019-01-17 DIAGNOSIS — Z1159 Encounter for screening for other viral diseases: Secondary | ICD-10-CM | POA: Insufficient documentation

## 2019-01-17 NOTE — Discharge Instructions (Signed)

## 2019-01-18 LAB — NOVEL CORONAVIRUS, NAA (HOSP ORDER, SEND-OUT TO REF LAB; TAT 18-24 HRS): SARS-CoV-2, NAA: NOT DETECTED

## 2019-01-20 NOTE — Anesthesia Preprocedure Evaluation (Addendum)
Anesthesia Evaluation  Patient identified by MRN, date of birth, ID band Patient awake    Reviewed: Allergy & Precautions, NPO status , Patient's Chart, lab work & pertinent test results  History of Anesthesia Complications Negative for: history of anesthetic complications  Airway Mallampati: I   Neck ROM: Full    Dental no notable dental hx.    Pulmonary neg pulmonary ROS,    Pulmonary exam normal breath sounds clear to auscultation       Cardiovascular Exercise Tolerance: Good negative cardio ROS Normal cardiovascular exam Rhythm:Regular Rate:Normal     Neuro/Psych PSYCHIATRIC DISORDERS Anxiety Depression negative neurological ROS     GI/Hepatic GERD  Controlled,  Endo/Other  negative endocrine ROS  Renal/GU negative Renal ROS     Musculoskeletal  (+) Arthritis ,   Abdominal   Peds  Hematology Colon CA; remote hx DVT   Anesthesia Other Findings   Reproductive/Obstetrics                            Anesthesia Physical Anesthesia Plan  ASA: II  Anesthesia Plan: MAC   Post-op Pain Management:    Induction: Intravenous  PONV Risk Score and Plan: 2 and TIVA and Midazolam  Airway Management Planned: Natural Airway  Additional Equipment:   Intra-op Plan:   Post-operative Plan:   Informed Consent: I have reviewed the patients History and Physical, chart, labs and discussed the procedure including the risks, benefits and alternatives for the proposed anesthesia with the patient or authorized representative who has indicated his/her understanding and acceptance.       Plan Discussed with: CRNA  Anesthesia Plan Comments:        Anesthesia Quick Evaluation

## 2019-01-21 ENCOUNTER — Ambulatory Visit: Payer: PPO | Admitting: Anesthesiology

## 2019-01-21 ENCOUNTER — Other Ambulatory Visit: Payer: Self-pay

## 2019-01-21 ENCOUNTER — Ambulatory Visit
Admission: RE | Admit: 2019-01-21 | Discharge: 2019-01-21 | Disposition: A | Discharge status: Home / Self Care | Payer: PPO | Attending: Ophthalmology | Admitting: Ophthalmology

## 2019-01-21 ENCOUNTER — Encounter
Admission: RE | Disposition: A | Discharge status: Home / Self Care | Payer: Self-pay | Source: Home / Self Care | Attending: Ophthalmology

## 2019-01-21 DIAGNOSIS — M1990 Unspecified osteoarthritis, unspecified site: Secondary | ICD-10-CM | POA: Diagnosis not present

## 2019-01-21 DIAGNOSIS — Z86718 Personal history of other venous thrombosis and embolism: Secondary | ICD-10-CM | POA: Insufficient documentation

## 2019-01-21 DIAGNOSIS — K219 Gastro-esophageal reflux disease without esophagitis: Secondary | ICD-10-CM | POA: Diagnosis not present

## 2019-01-21 DIAGNOSIS — Z1159 Encounter for screening for other viral diseases: Secondary | ICD-10-CM | POA: Diagnosis not present

## 2019-01-21 DIAGNOSIS — F419 Anxiety disorder, unspecified: Secondary | ICD-10-CM | POA: Diagnosis not present

## 2019-01-21 DIAGNOSIS — H2512 Age-related nuclear cataract, left eye: Secondary | ICD-10-CM | POA: Insufficient documentation

## 2019-01-21 DIAGNOSIS — M8580 Other specified disorders of bone density and structure, unspecified site: Secondary | ICD-10-CM | POA: Diagnosis not present

## 2019-01-21 DIAGNOSIS — Z9049 Acquired absence of other specified parts of digestive tract: Secondary | ICD-10-CM | POA: Insufficient documentation

## 2019-01-21 DIAGNOSIS — Z85038 Personal history of other malignant neoplasm of large intestine: Secondary | ICD-10-CM | POA: Insufficient documentation

## 2019-01-21 DIAGNOSIS — Z79899 Other long term (current) drug therapy: Secondary | ICD-10-CM | POA: Diagnosis not present

## 2019-01-21 DIAGNOSIS — H25812 Combined forms of age-related cataract, left eye: Secondary | ICD-10-CM | POA: Diagnosis not present

## 2019-01-21 HISTORY — PX: CATARACT EXTRACTION W/PHACO: SHX586

## 2019-01-21 SURGERY — PHACOEMULSIFICATION, CATARACT, WITH IOL INSERTION
Anesthesia: Monitor Anesthesia Care | Site: Eye | Laterality: Left

## 2019-01-21 MED ORDER — NA CHONDROIT SULF-NA HYALURON 40-17 MG/ML IO SOLN
INTRAOCULAR | Status: DC | PRN
  Administered 2019-01-21: 1 mL via INTRAOCULAR

## 2019-01-21 MED ORDER — ARMC OPHTHALMIC DILATING DROPS
1.0000 "application " | OPHTHALMIC | Status: DC | PRN
  Administered 2019-01-21 (×3): 1 via OPHTHALMIC

## 2019-01-21 MED ORDER — BRIMONIDINE TARTRATE-TIMOLOL 0.2-0.5 % OP SOLN
OPHTHALMIC | Status: DC | PRN
  Administered 2019-01-21: 1 [drp] via OPHTHALMIC

## 2019-01-21 MED ORDER — EPINEPHRINE PF 1 MG/ML IJ SOLN
INTRAOCULAR | Status: DC | PRN
  Administered 2019-01-21: 68 mL via OPHTHALMIC

## 2019-01-21 MED ORDER — ACETAMINOPHEN 325 MG PO TABS: 650.0000 mg | ORAL_TABLET | Freq: Once | ORAL | Status: DC | PRN

## 2019-01-21 MED ORDER — ONDANSETRON HCL 4 MG/2ML IJ SOLN: 4.0000 mg | Freq: Once | INTRAMUSCULAR | Status: DC | PRN

## 2019-01-21 MED ORDER — LACTATED RINGERS IV SOLN: INTRAVENOUS | Status: DC

## 2019-01-21 MED ORDER — MIDAZOLAM HCL 2 MG/2ML IJ SOLN
INTRAMUSCULAR | Status: DC | PRN
  Administered 2019-01-21: 1 mg via INTRAVENOUS

## 2019-01-21 MED ORDER — TETRACAINE HCL 0.5 % OP SOLN
1.0000 [drp] | OPHTHALMIC | Status: DC | PRN
  Administered 2019-01-21 (×3): 1 [drp] via OPHTHALMIC

## 2019-01-21 MED ORDER — MOXIFLOXACIN HCL 0.5 % OP SOLN
OPHTHALMIC | Status: DC | PRN
  Administered 2019-01-21: 0.2 mL via OPHTHALMIC

## 2019-01-21 MED ORDER — LIDOCAINE HCL (PF) 2 % IJ SOLN
INTRAOCULAR | Status: DC | PRN
  Administered 2019-01-21: 1 mL

## 2019-01-21 MED ORDER — FENTANYL CITRATE (PF) 100 MCG/2ML IJ SOLN
INTRAMUSCULAR | Status: DC | PRN
  Administered 2019-01-21: 50 ug via INTRAVENOUS

## 2019-01-21 MED ORDER — ACETAMINOPHEN 160 MG/5ML PO SOLN: 325.0000 mg | ORAL | Status: DC | PRN

## 2019-01-21 SURGICAL SUPPLY — 19 items
CANNULA ANT/CHMB 27G (MISCELLANEOUS) ×1 IMPLANT
CANNULA ANT/CHMB 27GA (MISCELLANEOUS) ×2 IMPLANT
GLOVE SURG LX 8.0 MICRO (GLOVE) ×1
GLOVE SURG LX STRL 8.0 MICRO (GLOVE) ×1 IMPLANT
GLOVE SURG TRIUMPH 8.0 PF LTX (GLOVE) ×2 IMPLANT
GOWN STRL REUS W/ TWL LRG LVL3 (GOWN DISPOSABLE) ×2 IMPLANT
GOWN STRL REUS W/TWL LRG LVL3 (GOWN DISPOSABLE) ×2
LENS IOL TECNIS ITEC 25.0 (Intraocular Lens) ×1 IMPLANT
MARKER SKIN DUAL TIP RULER LAB (MISCELLANEOUS) ×2 IMPLANT
NDL FILTER BLUNT 18X1 1/2 (NEEDLE) ×1 IMPLANT
NDL RETROBULBAR .5 NSTRL (NEEDLE) ×2 IMPLANT
NEEDLE FILTER BLUNT 18X 1/2SAF (NEEDLE) ×1
NEEDLE FILTER BLUNT 18X1 1/2 (NEEDLE) ×1 IMPLANT
PACK CATARACT BRASINGTON (MISCELLANEOUS) ×1 IMPLANT
PACK EYE AFTER SURG (MISCELLANEOUS) ×2 IMPLANT
PACK OPTHALMIC (MISCELLANEOUS) ×2 IMPLANT
SYR 3ML LL SCALE MARK (SYRINGE) ×2 IMPLANT
SYR TB 1ML LUER SLIP (SYRINGE) ×2 IMPLANT
WIPE NON LINTING 3.25X3.25 (MISCELLANEOUS) ×2 IMPLANT

## 2019-01-21 NOTE — Op Note (Signed)
PREOPERATIVE DIAGNOSIS:  Nuclear sclerotic cataract of the left eye.   POSTOPERATIVE DIAGNOSIS:  Nuclear sclerotic cataract of the left eye.   OPERATIVE PROCEDURE: Procedure(s): CATARACT EXTRACTION PHACO AND INTRAOCULAR LENS PLACEMENT ([place]) LEFT   SURGEON:  Birder Robson, MD.   ANESTHESIA:  Anesthesiologist: Darrin Nipper, MD CRNA: Cameron Ali, CRNA  1.      Managed anesthesia care. 2.     0.38ml of Shugarcaine was instilled following the paracentesis   COMPLICATIONS:  None.   TECHNIQUE:   Stop and chop   DESCRIPTION OF PROCEDURE:  The patient was examined and consented in the preoperative holding area where the aforementioned topical anesthesia was applied to the left eye and then brought back to the Operating Room where the left eye was prepped and draped in the usual sterile ophthalmic fashion and a lid speculum was placed. A paracentesis was created with the side port blade and the anterior chamber was filled with viscoelastic. A near clear corneal incision was performed with the steel keratome. A continuous curvilinear capsulorrhexis was performed with a cystotome followed by the capsulorrhexis forceps. Hydrodissection and hydrodelineation were carried out with BSS on a blunt cannula. The lens was removed in a stop and chop  technique and the remaining cortical material was removed with the irrigation-aspiration handpiece. The capsular bag was inflated with viscoelastic and the Technis ZCB00 lens was placed in the capsular bag without complication. The remaining viscoelastic was removed from the eye with the irrigation-aspiration handpiece. The wounds were hydrated. The anterior chamber was flushed with BSS and the eye was inflated to physiologic pressure. 0.13ml Vigamox was placed in the anterior chamber. The wounds were found to be water tight. The eye was dressed with Vigamox and Combigan. The patient was given protective glasses to wear throughout the day and a shield with which to  sleep tonight. The patient was also given drops with which to begin a drop regimen today and will follow-up with me in one day. Implant Name Type Inv. Item Serial No. Manufacturer Lot No. LRB No. Used  LENS IOL DIOP 25.0 - [id number] Intraocular Lens LENS IOL DIOP 25.0 [id number] AMO  Left 1    Procedure(s): CATARACT EXTRACTION PHACO AND INTRAOCULAR LENS PLACEMENT ([facility]) LEFT (Left)  Electronically signed: Birder Robson 01/21/2019 12:25 PM

## 2019-01-21 NOTE — H&P (Signed)
All labs reviewed. Abnormal studies sent to patients PCP when indicated.  Previous H&P reviewed, patient examined, there are NO CHANGES.  Lori Peters Porfilio5/19/202011:50 AM

## 2019-01-21 NOTE — Anesthesia Postprocedure Evaluation (Signed)
Anesthesia Post Note  Patient: Lori Peters  Procedure(s) Performed: CATARACT EXTRACTION PHACO AND INTRAOCULAR LENS PLACEMENT ([facility]) LEFT (Left Eye)  Patient location during evaluation: PACU Anesthesia Type: MAC Level of consciousness: awake and alert, oriented and patient cooperative Pain management: pain level controlled Vital Signs Assessment: post-procedure vital signs reviewed and stable Respiratory status: spontaneous breathing, nonlabored ventilation and respiratory function stable Cardiovascular status: blood pressure returned to baseline and stable Postop Assessment: adequate PO intake Anesthetic complications: no    Darrin Nipper

## 2019-01-21 NOTE — Anesthesia Procedure Notes (Signed)
Procedure Name: MAC Date/Time: 01/21/2019 12:04 PM Performed by: Cameron Ali, CRNA Pre-anesthesia Checklist: Patient identified, Emergency Drugs available, Suction available, Timeout performed and Patient being monitored Patient Re-evaluated:Patient Re-evaluated prior to induction Oxygen Delivery Method: Nasal cannula Placement Confirmation: positive ETCO2

## 2019-01-21 NOTE — Transfer of Care (Signed)
Immediate Anesthesia Transfer of Care Note  Patient: Lori Peters  Procedure(s) Performed: CATARACT EXTRACTION PHACO AND INTRAOCULAR LENS PLACEMENT ([facility]) LEFT (Left Eye)  Patient Location: PACU  Anesthesia Type: MAC  Level of Consciousness: awake, alert  and patient cooperative  Airway and Oxygen Therapy: Patient Spontanous Breathing and Patient connected to supplemental oxygen  Post-op Assessment: Post-op Vital signs reviewed, Patient's Cardiovascular Status Stable, Respiratory Function Stable, Patent Airway and No signs of Nausea or vomiting  Post-op Vital Signs: Reviewed and stable  Complications: No apparent anesthesia complications

## 2019-01-22 ENCOUNTER — Encounter: Payer: Self-pay | Admitting: Ophthalmology

## 2019-02-18 NOTE — H&P (Signed)
All labs reviewed. Abnormal studies sent to patients PCP when indicated.  Previous H&P reviewed, patient examined, there are NO CHANGES.  Gwyndolyn Saxon Porfilio6/16/20201:33 PM

## 2019-02-18 NOTE — H&P (Signed)
All labs reviewed. Abnormal studies sent to patients PCP when indicated.  Previous H&P reviewed, patient examined, there are NO CHANGES.  Gwyndolyn Saxon Porfilio6/16/20201:32 PM

## 2019-04-07 ENCOUNTER — Other Ambulatory Visit: Payer: Self-pay

## 2019-06-18 DIAGNOSIS — Z961 Presence of intraocular lens: Secondary | ICD-10-CM | POA: Diagnosis not present

## 2019-10-14 ENCOUNTER — Encounter: Payer: Self-pay | Admitting: Family Medicine

## 2019-10-14 ENCOUNTER — Ambulatory Visit (INDEPENDENT_AMBULATORY_CARE_PROVIDER_SITE_OTHER): Payer: PPO | Admitting: Family Medicine

## 2019-10-14 ENCOUNTER — Other Ambulatory Visit: Payer: Self-pay

## 2019-10-14 VITALS — BP 124/66 | HR 90 | Temp 97.4°F | Ht 60.0 in | Wt 118.5 lb

## 2019-10-14 DIAGNOSIS — C19 Malignant neoplasm of rectosigmoid junction: Secondary | ICD-10-CM

## 2019-10-14 DIAGNOSIS — H6122 Impacted cerumen, left ear: Secondary | ICD-10-CM | POA: Diagnosis not present

## 2019-10-14 HISTORY — DX: Malignant neoplasm of rectosigmoid junction: C19

## 2019-10-14 NOTE — Progress Notes (Signed)
Subjective:    Patient ID: Lori Peters, female    DOB: 11-13-1945, 74 y.o.   MRN: [id number]  This visit occurred during the SARS-CoV-2 public health emergency.  Safety protocols were in place, including screening questions prior to the visit, additional usage of staff PPE, and extensive cleaning of exam room while observing appropriate contact time as indicated for disinfecting solutions.    HPI Pt presents with L ear pain  Throbbing in nature  Worse yesterday Some pressure-uncomfortable  No trouble hearing  No discharge   occ has post nasal drainage  Also acid reflux   No cough or sinus pain   She has h/o cerumen impaction   Patient Active Problem List   Diagnosis Date Noted  . Colorectal cancer ([place]) 10/14/2019  . Impacted cerumen of left ear 10/21/2018  . Stress reaction 01/01/2017  . Tinnitus 01/01/2017  . Routine general medical examination at a health care facility 08/03/2016  . Encounter for hepatitis C screening test for low risk patient 08/03/2016  . Left-sided thoracic back pain 03/25/2015  . Enterocolitis 04/30/2013  . GERD 04/17/2008  . History of shingles 04/03/2007  . HYPERCHOLESTEROLEMIA 04/03/2007  . OSTEOARTHRITIS 04/03/2007  . Osteopenia 04/03/2007  . 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 DVT, HX OF 04/03/2007   Past Medical History:  Diagnosis Date  . Anxiety   . Arthritis   . Cancer ([place])    colon  . Depression   . GERD (gastroesophageal reflux disease)   . Palpitations    Past Surgical History:  Procedure Laterality Date  . APPENDECTOMY    . BREAST SURGERY     BIOPSY  . CATARACT EXTRACTION W/PHACO Right 11/12/2018   Procedure: CATARACT EXTRACTION PHACO AND INTRAOCULAR LENS PLACEMENT ([place]), RIGHT;  Surgeon: Birder Robson, MD;  Location: [facility];  Service: Ophthalmology;  Laterality: Right;  [place] 00:59.2 CDE 11.21 Fluid pack Lot # [id number]  . CATARACT EXTRACTION W/PHACO Left 01/21/2019   Procedure: CATARACT  EXTRACTION PHACO AND INTRAOCULAR LENS PLACEMENT ([facility]) LEFT;  Surgeon: Birder Robson, MD;  Location: [facility];  Service: Ophthalmology;  Laterality: Left;  . CHOLECYSTECTOMY    . COLON SURGERY    . OSTOMY TAKE DOWN     Social History   Tobacco Use  . Smoking status: Never Smoker  . Smokeless tobacco: Never Used  Substance Use Topics  . Alcohol use: No    Alcohol/week: 0.0 standard drinks  . Drug use: No   History reviewed. No pertinent family history. Allergies  Allergen Reactions  . Fosamax [Alendronate Sodium] Other (See Comments)    GI side eff    . Garlic Rash   Current Outpatient Medications on File Prior to Visit  Medication Sig Dispense Refill  . Calcium Carbonate-Vitamin D (CALTRATE 600+D PO) Take 1 tablet by mouth daily.    . famotidine (PEPCID) 20 MG tablet Take 20 mg by mouth at bedtime.      No current facility-administered medications on file prior to visit.    Review of Systems  Constitutional: Negative for activity change, appetite change, fatigue, fever and unexpected weight change.  HENT: Positive for ear pain and postnasal drip. Negative for congestion, ear discharge, facial swelling, hearing loss, rhinorrhea, sinus pressure, sore throat and voice change.   Eyes: Negative for pain, redness and visual disturbance.  Respiratory: Negative for cough, shortness of breath and wheezing.   Cardiovascular: Negative for chest pain and palpitations.  Gastrointestinal: Negative for abdominal pain, blood in  stool, constipation and diarrhea.  Endocrine: Negative for polydipsia and polyuria.  Genitourinary: Negative for dysuria, frequency and urgency.  Musculoskeletal: Negative for arthralgias, back pain and myalgias.  Skin: Negative for pallor and rash.  Allergic/Immunologic: Negative for environmental allergies.  Neurological: Negative for dizziness, syncope and headaches.  Hematological: Negative for adenopathy. Does not bruise/bleed easily.    Psychiatric/Behavioral: Negative for decreased concentration and dysphoric mood. The patient is not nervous/anxious.        Objective:   Physical Exam Constitutional:      General: She is not in acute distress.    Appearance: Normal appearance. She is normal weight. She is not ill-appearing.  HENT:     Right Ear: Tympanic membrane, ear canal and external ear normal. There is no impacted cerumen.     Left Ear: External ear normal. There is impacted cerumen.     Ears:     Comments: Left partial cerumen impaction    Nose: Nose normal.     Mouth/Throat:     Mouth: Mucous membranes are moist.     Pharynx: Oropharynx is clear.  Eyes:     General:        Right eye: No discharge.        Left eye: No discharge.     Conjunctiva/sclera: Conjunctivae normal.     Pupils: Pupils are equal, round, and reactive to light.  Cardiovascular:     Rate and Rhythm: Normal rate and regular rhythm.     Heart sounds: Normal heart sounds.  Pulmonary:     Effort: Pulmonary effort is normal. No respiratory distress.     Breath sounds: Normal breath sounds. No wheezing or rales.  Musculoskeletal:     Cervical back: Normal range of motion and neck supple.  Lymphadenopathy:     Cervical: No cervical adenopathy.  Skin:    General: Skin is warm and dry.     Findings: No erythema or rash.  Neurological:     Mental Status: She is alert.     Coordination: Coordination normal.  Psychiatric:        Mood and Affect: Mood normal.           Assessment & Plan:   Problem List Items Addressed This Visit      Digestive   Colorectal cancer ([place])    No re occurrence         Nervous and Auditory   Impacted cerumen of left ear - Primary    Partial cerumen impaction on L - improved but not totally resolved with simple ear irrigation  Given inst to use debox (or similar) ear solution otc twice weekly  F/u if no further improvement

## 2019-10-14 NOTE — Assessment & Plan Note (Signed)
No reoccurrence

## 2019-10-14 NOTE — Assessment & Plan Note (Signed)
Partial cerumen impaction on L - improved but not totally resolved with simple ear irrigation  Given inst to use debox (or similar) ear solution otc twice weekly  F/u if no further improvement

## 2019-10-14 NOTE — Patient Instructions (Signed)
Get over the counter Debrox drops (or other wax dissolving product) and use it as directed twice weekly  There is not a lot of wax left - but we want to keep it loose so it moves it   If pain persists please call and let me know   If other symptoms- congestion/ cough- also call

## 2019-11-26 DIAGNOSIS — Z961 Presence of intraocular lens: Secondary | ICD-10-CM | POA: Diagnosis not present

## 2019-12-04 DIAGNOSIS — L03211 Cellulitis of face: Secondary | ICD-10-CM | POA: Diagnosis not present

## 2019-12-31 DIAGNOSIS — L03211 Cellulitis of face: Secondary | ICD-10-CM | POA: Diagnosis not present

## 2020-01-12 DIAGNOSIS — H0011 Chalazion right upper eyelid: Secondary | ICD-10-CM | POA: Diagnosis not present

## 2020-01-21 DIAGNOSIS — H60332 Swimmer's ear, left ear: Secondary | ICD-10-CM | POA: Diagnosis not present

## 2020-01-21 DIAGNOSIS — H6122 Impacted cerumen, left ear: Secondary | ICD-10-CM | POA: Diagnosis not present

## 2020-01-28 DIAGNOSIS — H2000 Unspecified acute and subacute iridocyclitis: Secondary | ICD-10-CM | POA: Diagnosis not present

## 2020-02-04 DIAGNOSIS — H60542 Acute eczematoid otitis externa, left ear: Secondary | ICD-10-CM | POA: Diagnosis not present

## 2020-03-30 ENCOUNTER — Other Ambulatory Visit: Payer: Self-pay

## 2020-04-07 ENCOUNTER — Other Ambulatory Visit: Payer: Self-pay

## 2020-04-07 ENCOUNTER — Ambulatory Visit (INDEPENDENT_AMBULATORY_CARE_PROVIDER_SITE_OTHER): Payer: PPO

## 2020-04-07 DIAGNOSIS — Z0000 Encounter for general adult medical examination without abnormal findings: Secondary | ICD-10-CM

## 2020-04-07 NOTE — Patient Instructions (Addendum)
Lori Peters , Thank you for taking time to come for your Medicare Wellness Visit. I appreciate your ongoing commitment to your health goals. Please review the following plan we discussed and let me know if I can assist you in the future.   Screening recommendations/referrals: Colonoscopy: Up to date, completed 07/06/2015, due 07/2025 Mammogram: due, you will have this completed through Dr. Delanna Ahmadi office Bone Density: due, you will have this completed through Dr. Delanna Ahmadi office  Recommended yearly ophthalmology/optometry visit for glaucoma screening and checkup Recommended yearly dental visit for hygiene and checkup  Vaccinations: Influenza vaccine: due, will be available to the endo of the month in the office Pneumococcal vaccine: Completed series Tdap vaccine: decline, insurance/financial Shingles vaccine: Completed series   Covid-19: Completed series  Advanced directives: Advance directive discussed with you today. Even though you declined this today please call our office should you change your mind and we can give you the proper paperwork for you to fill out.  Conditions/risks identified: hypercholesterolemia  Next appointment: Follow up in one year for your annual wellness visit    Preventive Care 26 Years and Older, Female Preventive care refers to lifestyle choices and visits with your health care provider that can promote health and wellness. What does preventive care include?  A yearly physical exam. This is also called an annual well check.  Dental exams once or twice a year.  Routine eye exams. Ask your health care provider how often you should have your eyes checked.  Personal lifestyle choices, including:  Daily care of your teeth and gums.  Regular physical activity.  Eating a healthy diet.  Avoiding tobacco and drug use.  Limiting alcohol use.  Practicing safe sex.  Taking low-dose aspirin every day.  Taking vitamin and mineral supplements as  recommended by your health care provider. What happens during an annual well check? The services and screenings done by your health care provider during your annual well check will depend on your age, overall health, lifestyle risk factors, and family history of disease. Counseling  Your health care provider may ask you questions about your:  Alcohol use.  Tobacco use.  Drug use.  Emotional well-being.  Home and relationship well-being.  Sexual activity.  Eating habits.  History of falls.  Memory and ability to understand (cognition).  Work and work Statistician.  Reproductive health. Screening  You may have the following tests or measurements:  Height, weight, and BMI.  Blood pressure.  Lipid and cholesterol levels. These may be checked every 5 years, or more frequently if you are over 4 years old.  Skin check.  Lung cancer screening. You may have this screening every year starting at age 54 if you have a 30-pack-year history of smoking and currently smoke or have quit within the past 15 years.  Fecal occult blood test (FOBT) of the stool. You may have this test every year starting at age 42.  Flexible sigmoidoscopy or colonoscopy. You may have a sigmoidoscopy every 5 years or a colonoscopy every 10 years starting at age 79.  Hepatitis C blood test.  Hepatitis B blood test.  Sexually transmitted disease (STD) testing.  Diabetes screening. This is done by checking your blood sugar (glucose) after you have not eaten for a while (fasting). You may have this done every 1-3 years.  Bone density scan. This is done to screen for osteoporosis. You may have this done starting at age 52.  Mammogram. This may be done every 1-2 years. Talk to your  health care provider about how often you should have regular mammograms. Talk with your health care provider about your test results, treatment options, and if necessary, the need for more tests. Vaccines  Your health care  provider may recommend certain vaccines, such as:  Influenza vaccine. This is recommended every year.  Tetanus, diphtheria, and acellular pertussis (Tdap, Td) vaccine. You may need a Td booster every 10 years.  Zoster vaccine. You may need this after age 4.  Pneumococcal 13-valent conjugate (PCV13) vaccine. One dose is recommended after age 53.  Pneumococcal polysaccharide (PPSV23) vaccine. One dose is recommended after age 41. Talk to your health care provider about which screenings and vaccines you need and how often you need them. This information is not intended to replace advice given to you by your health care provider. Make sure you discuss any questions you have with your health care provider. Document Released: 09/17/2015 Document Revised: 05/10/2016 Document Reviewed: 06/22/2015 Elsevier Interactive Patient Education  2017 [place] Prevention in the Home Falls can cause injuries. They can happen to people of all ages. There are many things you can do to make your home safe and to help prevent falls. What can I do on the outside of my home?  Regularly fix the edges of walkways and driveways and fix any cracks.  Remove anything that might make you trip as you walk through a door, such as a raised step or threshold.  Trim any bushes or trees on the path to your home.  Use bright outdoor lighting.  Clear any walking paths of anything that might make someone trip, such as rocks or tools.  Regularly check to see if handrails are loose or broken. Make sure that both sides of any steps have handrails.  Any raised decks and porches should have guardrails on the edges.  Have any leaves, snow, or ice cleared regularly.  Use sand or salt on walking paths during winter.  Clean up any spills in your garage right away. This includes oil or grease spills. What can I do in the bathroom?  Use night lights.  Install grab bars by the toilet and in the tub and shower. Do  not use towel bars as grab bars.  Use non-skid mats or decals in the tub or shower.  If you need to sit down in the shower, use a plastic, non-slip stool.  Keep the floor dry. Clean up any water that spills on the floor as soon as it happens.  Remove soap buildup in the tub or shower regularly.  Attach bath mats securely with double-sided non-slip rug tape.  Do not have throw rugs and other things on the floor that can make you trip. What can I do in the bedroom?  Use night lights.  Make sure that you have a light by your bed that is easy to reach.  Do not use any sheets or blankets that are too big for your bed. They should not hang down onto the floor.  Have a firm chair that has side arms. You can use this for support while you get dressed.  Do not have throw rugs and other things on the floor that can make you trip. What can I do in the kitchen?  Clean up any spills right away.  Avoid walking on wet floors.  Keep items that you use a lot in easy-to-reach places.  If you need to reach something above you, use a strong step stool that has a  grab bar.  Keep electrical cords out of the way.  Do not use floor polish or wax that makes floors slippery. If you must use wax, use non-skid floor wax.  Do not have throw rugs and other things on the floor that can make you trip. What can I do with my stairs?  Do not leave any items on the stairs.  Make sure that there are handrails on both sides of the stairs and use them. Fix handrails that are broken or loose. Make sure that handrails are as long as the stairways.  Check any carpeting to make sure that it is firmly attached to the stairs. Fix any carpet that is loose or worn.  Avoid having throw rugs at the top or bottom of the stairs. If you do have throw rugs, attach them to the floor with carpet tape.  Make sure that you have a light switch at the top of the stairs and the bottom of the stairs. If you do not have them,  ask someone to add them for you. What else can I do to help prevent falls?  Wear shoes that:  Do not have high heels.  Have rubber bottoms.  Are comfortable and fit you well.  Are closed at the toe. Do not wear sandals.  If you use a stepladder:  Make sure that it is fully opened. Do not climb a closed stepladder.  Make sure that both sides of the stepladder are locked into place.  Ask someone to hold it for you, if possible.  Clearly mark and make sure that you can see:  Any grab bars or handrails.  First and last steps.  Where the edge of each step is.  Use tools that help you move around (mobility aids) if they are needed. These include:  Canes.  Walkers.  Scooters.  Crutches.  Turn on the lights when you go into a dark area. Replace any light bulbs as soon as they burn out.  Set up your furniture so you have a clear path. Avoid moving your furniture around.  If any of your floors are uneven, fix them.  If there are any pets around you, be aware of where they are.  Review your medicines with your doctor. Some medicines can make you feel dizzy. This can increase your chance of falling. Ask your doctor what other things that you can do to help prevent falls. This information is not intended to replace advice given to you by your health care provider. Make sure you discuss any questions you have with your health care provider. Document Released: 06/17/2009 Document Revised: 01/27/2016 Document Reviewed: 09/25/2014 Elsevier Interactive Patient Education  2017 [employer].

## 2020-04-07 NOTE — Progress Notes (Signed)
Subjective:   Lori Peters is a 74 y.o. female who presents for Medicare Annual (Subsequent) preventive examination.  Review of Systems: N/A      I connected with the patient today by telephone and verified that I am speaking with the correct person using two identifiers. Location patient: home Location nurse: work Persons participating in the virtual visit: patient, Marine scientist.   I discussed the limitations, risks, security and privacy concerns of performing an evaluation and management service by telephone and the availability of in person appointments. I also discussed with the patient that there may be a patient responsible charge related to this service. The patient expressed understanding and verbally consented to this telephonic visit.    Interactive audio and video telecommunications were attempted between this nurse and patient, however failed, due to patient having technical difficulties OR patient did not have access to video capability.  We continued and completed visit with audio only.     Cardiac Risk Factors include: advanced age (>58men, >55 women);Other (see comment), Risk factor comments: hypercholesterolemia     Objective:    Today's Vitals   There is no height or weight on file to calculate BMI.  Advanced Directives 04/07/2020 01/21/2019 01/07/2017 08/04/2016 04/22/2015  Does Patient Have a Medical Advance Directive? No No No No No  Would patient like information on creating a medical advance directive? No - Patient declined Yes (MAU/Ambulatory/Procedural Areas - Information given) - - No - patient declined information    Current Medications (verified) Outpatient Encounter Medications as of 04/07/2020  Medication Sig  . Calcium Carbonate-Vitamin D (CALTRATE 600+D PO) Take 1 tablet by mouth daily.  . famotidine (PEPCID) 20 MG tablet Take 20 mg by mouth at bedtime.    No facility-administered encounter medications on file as of 04/07/2020.    Allergies  (verified) Fosamax [alendronate sodium] and Garlic   History: Past Medical History:  Diagnosis Date  . Anxiety   . Arthritis   . Cancer ([place])    colon  . Depression   . GERD (gastroesophageal reflux disease)   . Palpitations    Past Surgical History:  Procedure Laterality Date  . APPENDECTOMY    . BREAST SURGERY     BIOPSY  . CATARACT EXTRACTION W/PHACO Right 11/12/2018   Procedure: CATARACT EXTRACTION PHACO AND INTRAOCULAR LENS PLACEMENT ([place]), RIGHT;  Surgeon: Birder Robson, MD;  Location: [facility];  Service: Ophthalmology;  Laterality: Right;  [place] 00:59.2 CDE 11.21 Fluid pack Lot # [id number]  . CATARACT EXTRACTION W/PHACO Left 01/21/2019   Procedure: CATARACT EXTRACTION PHACO AND INTRAOCULAR LENS PLACEMENT ([facility]) LEFT;  Surgeon: Birder Robson, MD;  Location: [facility];  Service: Ophthalmology;  Laterality: Left;  . CHOLECYSTECTOMY    . COLON SURGERY    . OSTOMY TAKE DOWN     History reviewed. No pertinent family history. Social History   Socioeconomic History  . Marital status: Married    Spouse name: Not on file  . Number of children: Not on file  . Years of education: Not on file  . Highest education level: Not on file  Occupational History  . Not on file  Tobacco Use  . Smoking status: Never Smoker  . Smokeless tobacco: Never Used  Substance and Sexual Activity  . Alcohol use: No    Alcohol/week: 0.0 standard drinks  . Drug use: No  . Sexual activity: Not on file  Other Topics Concern  . Not on file  Social History Narrative  . Not on file  Social Determinants of Health   Financial Resource Strain: Low Risk   . Difficulty of Paying Living Expenses: Not hard at all  Food Insecurity: No Food Insecurity  . Worried About Charity fundraiser in the Last Year: Never true  . Ran Out of Food in the Last Year: Never true  Transportation Needs: No Transportation Needs  . Lack of Transportation (Medical): No  . Lack of Transportation  (Non-Medical): No  Physical Activity: Inactive  . Days of Exercise per Week: 0 days  . Minutes of Exercise per Session: 0 min  Stress: No Stress Concern Present  . Feeling of Stress : Not at all  Social Connections:   . Frequency of Communication with Friends and Family:   . Frequency of Social Gatherings with Friends and Family:   . Attends Religious Services:   . Active Member of Clubs or Organizations:   . Attends Archivist Meetings:   Marland Kitchen Marital Status:     Tobacco Counseling Counseling given: Not Answered   Clinical Intake:  Pre-visit preparation completed: Yes  Pain : No/denies pain     Nutritional Risks: None Diabetes: No  How often do you need to have someone help you when you read instructions, pamphlets, or other written materials from your doctor or pharmacy?: 1 - Never What is the last grade level you completed in school?: 12th  Diabetic: No Nutrition Risk Assessment:  Has the patient had any N/V/D within the last 2 months?  No  Does the patient have any non-healing wounds?  No  Has the patient had any unintentional weight loss or weight gain?  No   Diabetes:  Is the patient diabetic?  No  If diabetic, was a CBG obtained today?  N/A Did the patient bring in their glucometer from home?  N/A How often do you monitor your CBG's? N/A.   Financial Strains and Diabetes Management:  Are you having any financial strains with the device, your supplies or your medication? N/A.  Does the patient want to be seen by Chronic Care Management for management of their diabetes?  N/A Would the patient like to be referred to a Nutritionist or for Diabetic Management?  N/A     Interpreter Needed?: No  Information entered by :: CJohnson, LPN   Activities of Daily Living In your present state of health, do you have any difficulty performing the following activities: 04/07/2020  Hearing? N  Vision? N  Difficulty concentrating or making decisions? N   Walking or climbing stairs? N  Dressing or bathing? N  Doing errands, shopping? N  Preparing Food and eating ? N  Using the Toilet? N  In the past six months, have you accidently leaked urine? N  Do you have problems with loss of bowel control? N  Managing your Medications? N  Managing your Finances? N  Housekeeping or managing your Housekeeping? N  Some recent data might be hidden    Patient Care Team: Tower, Wynelle Fanny, MD as PCP - General Tower, Wynelle Fanny, MD Molli Posey, MD as Consulting Physician (Obstetrics and Gynecology) Estill Cotta, MD as Consulting Physician (Ophthalmology) Ocie Cornfield, DMD as Consulting Physician (Dentistry)  Indicate any recent Medical Services you may have received from other than [facility] providers in the past year (date may be approximate).     Assessment:   This is a routine wellness examination for Ruari.  Hearing/Vision screen  Hearing Screening   125Hz  250Hz  500Hz  1000Hz  2000Hz  3000Hz  4000Hz  6000Hz  8000Hz   Right ear:           Left ear:           Vision Screening Comments: Patient gets annual eye exams.  Dietary issues and exercise activities discussed: Current Exercise Habits: The patient does not participate in regular exercise at present, Exercise limited by: None identified  Goals    . Increase physical activity     Starting 09/04/2016, I will attempt to walk at least 60 min 3-4 days per week.     . Patient Stated     04/07/2020, I will maintain and continue medications as prescribed.      Depression Screen PHQ 2/9 Scores 04/07/2020 08/04/2016 04/30/2013  PHQ - 2 Score 0 0 0  PHQ- 9 Score 0 - -    Fall Risk Fall Risk  04/07/2020 04/07/2019 08/04/2016 04/30/2013  Falls in the past year? 0 0 No No  Comment - Emmi Telephone Survey: data to providers prior to load - -  Number falls in past yr: 0 - - -  Injury with Fall? 0 - - -  Risk for fall due to : No Fall Risks - - -  Follow up Falls evaluation completed;Falls prevention  discussed - - -    Any stairs in or around the home? Yes  If so, are there any without handrails? No  Home free of loose throw rugs in walkways, pet beds, electrical cords, etc? Yes  Adequate lighting in your home to reduce risk of falls? Yes   ASSISTIVE DEVICES UTILIZED TO PREVENT FALLS:  Life alert? No  Use of a cane, walker or w/c? No  Grab bars in the bathroom? No  Shower chair or bench in shower? No  Elevated toilet seat or a handicapped toilet? No   TIMED UP AND GO:  Was the test performed? N/A, telephonic visit .    Cognitive Function: MMSE - Mini Mental State Exam 04/07/2020 08/04/2016  Orientation to time 5 5  Orientation to Place 5 5  Registration 3 3  Attention/ Calculation 5 0  Recall 3 3  Language- name 2 objects - 0  Language- repeat 1 1  Language- follow 3 step command - 3  Language- read & follow direction - 0  Write a sentence - 0  Copy design - 0  Total score - 20  Mini Cog  Mini-Cog screen was completed. Maximum score is 22. A value of 0 denotes this part of the MMSE was not completed or the patient failed this part of the Mini-Cog screening.       Immunizations Immunization History  Administered Date(s) Administered  . Influenza Split 08/04/2011  . Influenza Whole 06/02/2008, 06/24/2009  . Influenza, High Dose Seasonal PF 06/11/2017, 06/28/2018, 05/28/2019  . Influenza,inj,Quad PF,6+ Mos 04/30/2013, 06/16/2014, 07/20/2015, 06/08/2016  . PFIZER SARS-COV-2 Vaccination 10/11/2019, 11/01/2019  . Pneumococcal Conjugate-13 08/04/2016  . Pneumococcal Polysaccharide-23 04/30/2013  . Zoster 02/04/2013  . Zoster Recombinat (Shingrix) 07/28/2019, 09/14/2019    TDAP status: Due, Education has been provided regarding the importance of this vaccine. Advised may receive this vaccine at local pharmacy or Health Dept. Aware to provide a copy of the vaccination record if obtained from local pharmacy or Health Dept. Verbalized acceptance and understanding. Flu  Vaccine status: due, will be available at the end of August in the office.  Pneumococcal vaccine status: Up to date Covid-19 vaccine status: Completed vaccines  Qualifies for Shingles Vaccine? Yes   Zostavax completed Yes   Shingrix Completed?: Yes  Screening Tests Health Maintenance  Topic Date Due  . MAMMOGRAM  06/04/2017  . INFLUENZA VACCINE  04/04/2020  . TETANUS/TDAP  08/04/2026 (Originally 07/16/1965)  . COLONOSCOPY  07/05/2025  . DEXA SCAN  Completed  . COVID-19 Vaccine  Completed  . Hepatitis C Screening  Completed  . PNA vac Low Risk Adult  Completed    Health Maintenance  Health Maintenance Due  Topic Date Due  . MAMMOGRAM  06/04/2017  . INFLUENZA VACCINE  04/04/2020    Colorectal cancer screening: Completed 07/06/2015. Repeat every 10 years Mammogram status:due, Patient has this completed through Dr. Delanna Ahmadi office. Bone Density status: due, Patient has this completed through Dr. Delanna Ahmadi office.  Lung Cancer Screening: (Low Dose CT Chest recommended if Age 28-80 years, 30 pack-year currently smoking OR have quit w/in 15 years.) does not qualify.    Additional Screening:  Hepatitis C Screening: does qualify; Completed 08/04/2016  Vision Screening: Recommended annual ophthalmology exams for early detection of glaucoma and other disorders of the eye. Is the patient up to date with their annual eye exam?  Yes  Who is the provider or what is the name of the office in which the patient attends annual eye exams? Dr. Linton Flemings If pt is not established with a provider, would they like to be referred to a provider to establish care? No .   Dental Screening: Recommended annual dental exams for proper oral hygiene  Community Resource Referral / Chronic Care Management: CRR required this visit?  No   CCM required this visit?  No      Plan:     I have personally reviewed and noted the following in the patient's chart:   . Medical and social history . Use of  alcohol, tobacco or illicit drugs  . Current medications and supplements . Functional ability and status . Nutritional status . Physical activity . Advanced directives . List of other physicians . Hospitalizations, surgeries, and ER visits in previous 12 months . Vitals . Screenings to include cognitive, depression, and falls . Referrals and appointments  In addition, I have reviewed and discussed with patient certain preventive protocols, quality metrics, and best practice recommendations. A written personalized care plan for preventive services as well as general preventive health recommendations were provided to patient.   Due to this being a telephonic visit, the after visit summary with patients personalized plan was offered to patient via mail or my-chart. Patient preferred to pick up at office at next visit.   Andrez Grime, LPN   03/11/[id number]

## 2020-04-07 NOTE — Progress Notes (Signed)
PCP notes:  Health Maintenance: Mammogram- due, will have this done through Dr. Matthew Saras Dexa- due, will have this done through Dr. Matthew Saras Flu- due  Abnormal Screenings: none   Patient concerns: none   Nurse concerns: none   Next PCP appt.: none

## 2020-11-15 DIAGNOSIS — Z01812 Encounter for preprocedural laboratory examination: Secondary | ICD-10-CM | POA: Diagnosis not present

## 2020-11-18 DIAGNOSIS — Z85038 Personal history of other malignant neoplasm of large intestine: Secondary | ICD-10-CM | POA: Diagnosis not present

## 2020-11-18 DIAGNOSIS — D120 Benign neoplasm of cecum: Secondary | ICD-10-CM | POA: Diagnosis not present

## 2020-11-18 DIAGNOSIS — Z980 Intestinal bypass and anastomosis status: Secondary | ICD-10-CM | POA: Diagnosis not present

## 2020-11-18 DIAGNOSIS — D122 Benign neoplasm of ascending colon: Secondary | ICD-10-CM | POA: Diagnosis not present

## 2020-11-18 DIAGNOSIS — K640 First degree hemorrhoids: Secondary | ICD-10-CM | POA: Diagnosis not present

## 2020-11-23 DIAGNOSIS — D122 Benign neoplasm of ascending colon: Secondary | ICD-10-CM | POA: Diagnosis not present

## 2020-11-23 DIAGNOSIS — D120 Benign neoplasm of cecum: Secondary | ICD-10-CM | POA: Diagnosis not present

## 2020-11-29 DIAGNOSIS — H43813 Vitreous degeneration, bilateral: Secondary | ICD-10-CM | POA: Diagnosis not present

## 2021-01-10 ENCOUNTER — Other Ambulatory Visit: Payer: Self-pay

## 2021-01-10 ENCOUNTER — Encounter: Payer: Self-pay | Admitting: Family Medicine

## 2021-01-10 ENCOUNTER — Ambulatory Visit (INDEPENDENT_AMBULATORY_CARE_PROVIDER_SITE_OTHER): Payer: PPO | Admitting: Family Medicine

## 2021-01-10 VITALS — BP 120/70 | HR 75 | Temp 98.1°F | Ht 60.0 in | Wt 117.2 lb

## 2021-01-10 DIAGNOSIS — Z23 Encounter for immunization: Secondary | ICD-10-CM

## 2021-01-10 DIAGNOSIS — R0781 Pleurodynia: Secondary | ICD-10-CM | POA: Diagnosis not present

## 2021-01-10 DIAGNOSIS — S81811A Laceration without foreign body, right lower leg, initial encounter: Secondary | ICD-10-CM

## 2021-01-10 NOTE — Progress Notes (Signed)
[name] T. [name], MD, [place] at [facility] [place], [address]  Phone: [phone]  FAX: [place] - 75 y.o. female  MRN [id number]  Date of Birth: [birth year]  Date: 01/10/2021  PCP: Abner Greenspan, MD  Referral: Abner Greenspan, MD  Chief Complaint  Patient presents with  . Fall    Pushed a stool back and got foot caught.  [place] and her chin/chest hit floor  . Pain in Right Rib Area  . Feels like clearing throat all the time    This visit occurred during the SARS-CoV-2 public health emergency.  Safety protocols were in place, including screening questions prior to the visit, additional usage of staff PPE, and extensive cleaning of exam room while observing appropriate contact time as indicated for disinfecting solutions.   Subjective:   Lori Peters is a 75 y.o. very pleasant female patient with Body mass index is 22.9 kg/m. who presents with the following:  Thursday afternoon, she tripped and caught her foot.  Subsequently she fell and her chest on the right side as well as her chin struck the ground.  She has continued to have some rib pain on the right side as well as pain with taking a deep breath.  Additionally, she does have a shallow laceration on the right lower leg.  Her tetanus is not up-to-date.  She has normal mentation, and she did not strike her head.  Immunization History  Administered Date(s) Administered  . Fluad Quad(high Dose 76+) 08/09/2020  . Influenza Split 08/04/2011  . Influenza Whole 06/02/2008, 06/24/2009  . Influenza, High Dose Seasonal PF 06/11/2017, 06/28/2018, 05/28/2019  . Influenza,inj,Quad PF,6+ Mos 04/30/2013, 06/16/2014, 07/20/2015, 06/08/2016  . PFIZER(Purple Top)SARS-COV-2 Vaccination 10/11/2019, 11/01/2019  . Pneumococcal Conjugate-13 08/04/2016  . Pneumococcal Polysaccharide-23 04/30/2013  . Zoster 02/04/2013   . Zoster Recombinat (Shingrix) 07/28/2019, 09/14/2019     Review of Systems is noted in the HPI, as appropriate   Objective:   BP 120/70   Pulse 75   Temp 98.1 F (36.7 C) (Temporal)   Ht 5' (1.524 m)   Wt 117 lb 4 oz (53.2 kg)   SpO2 98%   BMI 22.90 kg/m   No barrel sign.  Nontender throughout the entirety of the left ribs.  On the right side approximately at rib 9 the patient does have some pain with palpation, but it is fairly mild.  She also has a laceration on her mid lower extremity laterally.  This is good granulation tissue.  There is no redness and warmth  Radiology: No results found.  Assessment and Plan:     ICD-10-CM   1. Rib pain on right side  R07.81   2. Laceration of right leg excluding thigh, initial encounter  S81.811A Td vaccine greater than or equal to 7yo preservative free IM   Rib contusions, very unlikely rib fracture.  Continue with supportive measures, over-the-counter meds.  My suspicion is this will resolve completely within 3 weeks.  Likely sooner.  Laceration of right leg healing well with good granulation tissue.  Tetanus today.  Orders Placed This Encounter  Procedures  . Td vaccine greater than or equal to 7yo preservative free IM    Follow-up: As needed  Signed,  Aurore Redinger T. Elynor Kallenberger, MD   Outpatient Encounter Medications as of 01/10/2021  Medication Sig  . Calcium Carbonate-Vitamin D (CALTRATE 600+D PO) Take 1  tablet by mouth daily.  . [DISCONTINUED] famotidine (PEPCID) 20 MG tablet Take 20 mg by mouth at bedtime.    No facility-administered encounter medications on file as of 01/10/2021.

## 2021-05-26 ENCOUNTER — Other Ambulatory Visit: Payer: Self-pay

## 2021-05-26 ENCOUNTER — Ambulatory Visit
Admission: EM | Admit: 2021-05-26 | Discharge: 2021-05-26 | Disposition: A | Discharge status: Home / Self Care | Payer: PPO | Attending: Emergency Medicine | Admitting: Emergency Medicine

## 2021-05-26 DIAGNOSIS — R059 Cough, unspecified: Secondary | ICD-10-CM | POA: Diagnosis not present

## 2021-05-26 DIAGNOSIS — B349 Viral infection, unspecified: Secondary | ICD-10-CM | POA: Diagnosis not present

## 2021-05-26 MED ORDER — BENZONATATE 100 MG PO CAPS: 100.0000 mg | ORAL_CAPSULE | Freq: Three times a day (TID) | ORAL | 0 refills | Status: DC

## 2021-05-26 NOTE — ED Triage Notes (Signed)
Patient presents to Urgent Care with complaints of cough and congestion x 2 days ago. Pt had a negative covid test. Treating symptoms with cold/cough meds.   Denies fever.

## 2021-05-26 NOTE — ED Provider Notes (Signed)
CHIEF COMPLAINT:   Chief Complaint  Patient presents with   Cough     SUBJECTIVE/HPI:   Cough A very pleasant 75 y.o.Female presents today with cough and congestion for the last 2 days.  Patient reports a negative COVID test.  Has been treating symptoms with cold/cough medication. Patient does not report any shortness of breath, chest pain, palpitations, visual changes, weakness, tingling, headache, nausea, vomiting, diarrhea, fever, chills.   has a past medical history of Anxiety, Arthritis, Cancer ([place]), Depression, GERD (gastroesophageal reflux disease), and Palpitations.  ROS:  Review of Systems  Respiratory:  Positive for cough.   See Subjective/HPI Medications, Allergies and Problem List personally reviewed in Epic today OBJECTIVE:   Vitals:   05/26/21 0912  BP: (!) 146/81  Pulse: 85  Resp: 16  Temp: 97.9 F (36.6 C)  SpO2: 98%    Physical Exam   General: Appears well-developed and well-nourished. No acute distress.  HEENT Head: Normocephalic and atraumatic.  Ears: Hearing grossly intact, no drainage or visible deformity.  Nose: No nasal deviation.   Mouth/Throat: No stridor or tracheal deviation.  Non erythematous posterior pharynx noted with clear drainage present.  No white patchy exudate noted. Eyes: Conjunctivae and EOM are normal. No eye drainage or scleral icterus bilaterally.  Neck: Normal range of motion, neck is supple. No cervical, tonsillar or submandibular lymph nodes palpated.   Cardiovascular: Normal rate. Regular rhythm; no murmurs, gallops, or rubs.  Pulm/Chest: No respiratory distress. Breath sounds normal bilaterally without wheezes, rhonchi, or rales.  Neurological: Alert and oriented to person, place, and time.  Skin: Skin is warm and dry.  No rashes, lesions, abrasions or bruising noted to skin.   Psychiatric: Normal mood, affect, behavior, and thought content.   Vital signs and nursing note reviewed.   Patient stable and cooperative with  examination. PROCEDURES:    LABS/X-RAYS/EKG/MEDS:   No results found for any visits on 05/26/21.  MEDICAL DECISION MAKING:   Patient presents with cough and congestion for the last 2 days.  Patient reports a negative COVID test.  Has been treating symptoms with cold/cough medication. Patient does not report any shortness of breath, chest pain, palpitations, visual changes, weakness, tingling, headache, nausea, vomiting, diarrhea, fever, chills.  COVID-19 PCR obtained in clinic today.  Given symptoms along with assessment findings and duration, likely viral illness.  Advised about home treatment and care to include rest, fluids, Tylenol versus ibuprofen, Mucinex to take with her Lori Peters which were prescribed today.  Advised to return for any new onset fever, difficulty breathing, chest pain, symptoms lasting longer than 3 to 4 weeks or bloody sputum.  Patient verbalized understanding and agreed with treatment plan.  Patient stable upon discharge. ASSESSMENT/PLAN:  1. Viral illness - Novel Coronavirus, NAA ([facility]); Standing - Novel Coronavirus, NAA ([facility]) - benzonatate (TESSALON) 100 MG capsule; Take 1 capsule (100 mg total) by mouth every 8 (eight) hours.  Dispense: 21 capsule; Refill: 0 Instructions about new medications and side effects provided.  Plan:   Discharge Instructions      We will call you with any positive results from your COVID-19 testing completed in clinic today.  If you do not receive a phone call from [place] within the next 2-3 days, check your MyChart for up-to-date health information related to testing completed in clinic today.  Rest, push lots of fluids (especially water), and utilize supportive care for symptoms. You may take take acetaminophen (Tylenol) every 4-6 hours or ibuprofen every 6-8 hours for muscle  pain, joint pain, headaches. Mucinex (guaifenesin) may be taken over the counter for cough as needed and can loosen phlegm. Please read the  instructions and take as directed. Saline nasal sprays to rinse congestion can help as well. Warm tea with lemon and honey can sooth sore throat and cough, as can cough drops.  Take Tessalon Perles with Mucinex for cough.  Return to clinic for new-onset fever, difficulty breathing, chest pain, symptoms lasting >3 to 4 weeks, or bloody sputum.          Lori Peters, [place] 05/26/21 [phone]

## 2021-05-26 NOTE — Discharge Instructions (Addendum)
We will call you with any positive results from your COVID-19 testing completed in clinic today.  If you do not receive a phone call from [place] within the next 2-3 days, check your MyChart for up-to-date health information related to testing completed in clinic today.  Rest, push lots of fluids (especially water), and utilize supportive care for symptoms. You may take take acetaminophen (Tylenol) every 4-6 hours or ibuprofen every 6-8 hours for muscle pain, joint pain, headaches. Mucinex (guaifenesin) may be taken over the counter for cough as needed and can loosen phlegm. Please read the instructions and take as directed. Saline nasal sprays to rinse congestion can help as well. Warm tea with lemon and honey can sooth sore throat and cough, as can cough drops.  Take Tessalon Perles with Mucinex for cough.  Return to clinic for new-onset fever, difficulty breathing, chest pain, symptoms lasting >3 to 4 weeks, or bloody sputum.

## 2021-05-27 LAB — SARS-COV-2, NAA 2 DAY TAT

## 2021-05-27 LAB — NOVEL CORONAVIRUS, NAA: SARS-CoV-2, NAA: DETECTED — AB

## 2021-09-26 ENCOUNTER — Telehealth: Payer: Self-pay

## 2021-09-26 ENCOUNTER — Encounter: Payer: Self-pay | Admitting: Family

## 2021-09-26 ENCOUNTER — Ambulatory Visit (INDEPENDENT_AMBULATORY_CARE_PROVIDER_SITE_OTHER): Payer: PPO | Admitting: Family

## 2021-09-26 ENCOUNTER — Other Ambulatory Visit: Payer: Self-pay

## 2021-09-26 ENCOUNTER — Ambulatory Visit (INDEPENDENT_AMBULATORY_CARE_PROVIDER_SITE_OTHER): Payer: PPO

## 2021-09-26 VITALS — BP 142/78 | HR 81 | Temp 97.1°F | Ht 60.0 in | Wt 114.0 lb

## 2021-09-26 DIAGNOSIS — R739 Hyperglycemia, unspecified: Secondary | ICD-10-CM | POA: Diagnosis not present

## 2021-09-26 DIAGNOSIS — R7303 Prediabetes: Secondary | ICD-10-CM | POA: Insufficient documentation

## 2021-09-26 DIAGNOSIS — M25551 Pain in right hip: Secondary | ICD-10-CM

## 2021-09-26 DIAGNOSIS — R7309 Other abnormal glucose: Secondary | ICD-10-CM | POA: Insufficient documentation

## 2021-09-26 DIAGNOSIS — D72829 Elevated white blood cell count, unspecified: Secondary | ICD-10-CM

## 2021-09-26 LAB — CBC WITH DIFFERENTIAL/PLATELET
Basophils Absolute: 0 10*3/uL (ref 0.0–0.1)
Basophils Relative: 0.6 % (ref 0.0–3.0)
Eosinophils Absolute: 0 10*3/uL (ref 0.0–0.7)
Eosinophils Relative: 0.6 % (ref 0.0–5.0)
HCT: 39.2 % (ref 36.0–46.0)
Hemoglobin: 13 g/dL (ref 12.0–15.0)
Lymphocytes Relative: 36.6 % (ref 12.0–46.0)
Lymphs Abs: 2.4 10*3/uL (ref 0.7–4.0)
MCHC: 33.2 g/dL (ref 30.0–36.0)
MCV: 87.7 fl (ref 78.0–100.0)
Monocytes Absolute: 0.4 10*3/uL (ref 0.1–1.0)
Monocytes Relative: 6 % (ref 3.0–12.0)
Neutro Abs: 3.7 10*3/uL (ref 1.4–7.7)
Neutrophils Relative %: 56.2 % (ref 43.0–77.0)
Platelets: 241 10*3/uL (ref 150.0–400.0)
RBC: 4.47 Mil/uL (ref 3.87–5.11)
RDW: 14 % (ref 11.5–15.5)
WBC: 6.6 10*3/uL (ref 4.0–10.5)

## 2021-09-26 LAB — COMPREHENSIVE METABOLIC PANEL
ALT: 16 U/L (ref 0–35)
AST: 21 U/L (ref 0–37)
Albumin: 4.4 g/dL (ref 3.5–5.2)
Alkaline Phosphatase: 38 U/L — ABNORMAL LOW (ref 39–117)
BUN: 13 mg/dL (ref 6–23)
CO2: 27 mEq/L (ref 19–32)
Calcium: 9.9 mg/dL (ref 8.4–10.5)
Chloride: 104 mEq/L (ref 96–112)
Creatinine, Ser: 0.66 mg/dL (ref 0.40–1.20)
GFR: 85.34 mL/min (ref >60.00)
Glucose, Bld: 96 mg/dL (ref 70–99)
Potassium: 4.5 mEq/L (ref 3.5–5.1)
Sodium: 139 mEq/L (ref 135–145)
Total Bilirubin: 0.4 mg/dL (ref 0.2–1.2)
Total Protein: 7.1 g/dL (ref 6.0–8.3)

## 2021-09-26 MED ORDER — METAXALONE 800 MG PO TABS: ORAL_TABLET | ORAL | 0 refills | Status: DC

## 2021-09-26 MED ORDER — NAPROXEN 500 MG PO TABS: 500.0000 mg | ORAL_TABLET | Freq: Two times a day (BID) | ORAL | 0 refills | Status: DC | PRN

## 2021-09-26 NOTE — Telephone Encounter (Signed)
Patient has been seen already.

## 2021-09-26 NOTE — Telephone Encounter (Signed)
[facility] - Client TELEPHONE ADVICE RECORD [facility] Patient Name: Lori Peters Gender: Female DOB: [birth year] Age: 76 Y 2 M 11 D Return Phone Number: [phone] (Primary), [phone] (Secondary) Address[place]  [address] Client [place] [facility] - Client Client Site [place] Provider Glori Bickers, Roque Lias - MD Contact Type Call Who Is Calling Patient / Member / Family / Caregiver Call Type Triage / Clinical Relationship To Patient Self Return Phone Number [phone] (Primary) Chief Complaint Abdominal Pain Reason for Call Symptomatic / Request for [place] states she has been having sharp pain on her right side. Translation No Nurse Assessment Nurse: Micki Riley, RN, Domenick Gong Date/Time (Eastern Time): 09/26/2021 8:40:43 AM Confirm and document reason for call. If symptomatic, describe symptoms. ---Caller states she is having sharp constant right-sided pain around hip; first note 2 Thursdays ago. Denies fever, recent injury, or any other symptoms. Has been taking tyl/advil, seems to help. Does the patient have any new or worsening symptoms? ---Yes Will a triage be completed? ---Yes Related visit to physician within the last 2 weeks? ---N/A Does the PT have any chronic conditions? (i.e. diabetes, asthma, this includes High risk factors for pregnancy, etc.) ---Unknown Is this a behavioral health or substance abuse call? ---No Guidelines Guideline Title Affirmed Question Affirmed Notes Nurse Date/Time (Eastern Time) Hip Pain [1] MODERATE pain (e.g., interferes with normal activities, limping) AND [2] present > 3 days Cazares, RN, Domenick Gong 09/26/2021 8:43:44 AM Disp. Time Eilene Ghazi Time) Disposition Final User 09/26/2021 8:47:16 AM SEE PCP WITHIN 3 DAYS Yes Cazares, RN, Vivien Rota Janie PLEASE NOTE: All timestamps  contained within this report are represented as [place] Standard Time. CONFIDENTIALTY NOTICE: This fax transmission is intended only for the addressee. It contains information that is legally privileged, confidential or otherwise protected from use or disclosure. If you are not the intended recipient, you are strictly prohibited from reviewing, disclosing, copying using or disseminating any of this information or taking any action in reliance on or regarding this information. If you have received this fax in error, please notify us immediately by telephone so that we can arrange for its return to [place]. Phone: [phone], Toll-Free: [phone], Fax: [phone] Page: 2 of 2 Call Id: [id number] [place] Disagree/Comply Comply Caller Understands Yes PreDisposition InappropriateToAsk Care Advice Given Per Guideline CALL BACK IF: * Fever or severe pain occurs * Skin redness or a rash appears * You become worse CARE ADVICE given per Hip Pain (Adult) guideline. SEE PCP WITHIN 3 DAYS: Comments User: Magnus Sinning, RN Date/Time Eilene Ghazi Time): 09/26/2021 8:44:28 AM 4/10 pain now Referrals REFERRED TO PCP OFFIC

## 2021-09-26 NOTE — Assessment & Plan Note (Signed)
This was 4 years ago however we will repeat CBC today.  Pending results

## 2021-09-26 NOTE — Assessment & Plan Note (Signed)
Unable to replicate the pain on exam however you can tell the patient is in pain as she appears to be uncomfortable.  Ordering right hip x-ray as well as given prescription for naproxen and Skelaxin as needed.  Also advised she can get over-the-counter lidocaine patches to help as needed.  If she does not have improvement and/or the x-ray does not say anything we will continue ongoing follow-up.  Have also ordered a CBC and a urinalysis to rule out any form of infection.

## 2021-09-26 NOTE — Assessment & Plan Note (Signed)
Patient with no recent lab work however history of hyperglycemia on lab work.  We will repeat CMP and also assess kidney function

## 2021-09-26 NOTE — Telephone Encounter (Signed)
Per appt notes pt already scheduled with appt 09/26/21 at 9:40 and pt has already been arrived to be seen at [facility]. Sending note to T Dugal FNP and [facility] CMA.

## 2021-09-26 NOTE — Patient Instructions (Addendum)
Complete xray(s) prior to leaving today. I will notify you of your results once received.  Stop by the lab prior to leaving today. I will notify you of your results once received.   I have sent in an RX for both naproxen and a muscle relaxer that will hopefully provide you with some relief. Also ok to get some lidocaine patches over the counter such as salon paas as this may give you some relief as well.   It was a pleasure seeing you today! Please do not hesitate to reach out with any questions and or concerns.  Regards,   Eugenia Pancoast FNP-C

## 2021-09-26 NOTE — Progress Notes (Signed)
Established Patient Office Visit  Subjective:  Patient ID: Lori Peters, female    DOB: 03-28-1946  Age: 76 y.o. MRN: [id number]  CC: No chief complaint on file.   HPI Lori Peters is here today with concerns.   Noticed about two weeks ago with right sided hip pains. The pain is constant most of the time, and it is dull and aching. She has tried otc advil and tylenol arthritis with some mild relief however this am when she woke up this am she took three advil, and it has provided no relief. She took advil around 715 am. No known injury or trauma. She doesn't have radiation of pain down the leg or thigh. Able to bear weight, sitting aggravates the pain more than anything.   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   Colonoscopy 11/18/20, polyps removed per pt pathology was negative for malignancy, due again in three years from last    Past Medical History:  Diagnosis Date   Anxiety    Arthritis    Cancer ([place])    colon   Colorectal cancer ([place]) 10/14/2019   Depression    GERD (gastroesophageal reflux disease)    Palpitations     Past Surgical History:  Procedure Laterality Date   APPENDECTOMY     BREAST SURGERY     BIOPSY   CATARACT EXTRACTION W/PHACO Right 11/12/2018   Procedure: CATARACT EXTRACTION PHACO AND INTRAOCULAR LENS PLACEMENT ([place]), RIGHT;  Surgeon: Birder Robson, MD;  Location: [facility] ORS;  Service: Ophthalmology;  Laterality: Right;  [place] 00:59.2 CDE 11.21 Fluid pack Lot # [id number]   CATARACT EXTRACTION W/PHACO Left 01/21/2019   Procedure: CATARACT EXTRACTION PHACO AND INTRAOCULAR LENS PLACEMENT ([place]) LEFT;  Surgeon: Birder Robson, MD;  Location: [place];  Service: Ophthalmology;  Laterality: Left;   CHOLECYSTECTOMY     COLON SURGERY     OSTOMY TAKE DOWN      History reviewed. No pertinent family history.  Social History   Socioeconomic History    Marital status: Married    Spouse name: Not on file   Number of children: Not on file   Years of education: Not on file   Highest education level: Not on file  Occupational History   Not on file  Tobacco Use   Smoking status: Never   Smokeless tobacco: Never  Substance and Sexual Activity   Alcohol use: No    Alcohol/week: 0.0 standard drinks   Drug use: No   Sexual activity: Not on file  Other Topics Concern   Not on file  Social History Narrative   Not on file   Social Determinants of Health   Financial Resource Strain: Not on file  Food Insecurity: Not on file  Transportation Needs: Not on file  Physical Activity: Not on file  Stress: Not on file  Social Connections: Not on file  Intimate Partner Violence: Not on file    Outpatient Medications Prior to Visit  Medication Sig Dispense Refill   Calcium Carbonate-Vitamin D (CALTRATE 600+D PO) Take 1 tablet by mouth daily.     benzonatate (TESSALON) 100 MG capsule Take 1 capsule (100 mg total) by mouth every 8 (eight) hours. 21 capsule 0   No facility-administered medications prior to visit.    Allergies  Allergen Reactions   Fosamax [Alendronate Sodium] Other (See Comments)    GI side eff  Garlic Rash    ROS Review of Systems  Constitutional:  Negative for chills and fever.  Respiratory:  Negative for cough and shortness of breath.   Cardiovascular:  Negative for chest pain and palpitations.  Gastrointestinal:  Negative for abdominal pain, diarrhea, nausea and vomiting.  Genitourinary:  Negative for flank pain, urgency, vaginal bleeding and vaginal discharge.  Musculoskeletal:  Positive for arthralgias (right hip pain, dull and aching). Negative for back pain.     Objective:    Physical Exam Constitutional:      General: She is not in acute distress.    Appearance: Normal appearance. She is normal weight. She is not ill-appearing, toxic-appearing or diaphoretic.  Cardiovascular:     Rate and Rhythm:  Normal rate and regular rhythm.  Abdominal:     General: Abdomen is flat. There is no distension.     Tenderness: There is no abdominal tenderness. There is no guarding. Negative signs include psoas sign.  Musculoskeletal:     Lumbar back: Normal. No tenderness or bony tenderness. Negative right straight leg raise test and negative left straight leg raise test.     Right hip: Normal. No tenderness. Normal range of motion.     Left hip: Normal. No tenderness. Normal range of motion.  Neurological:     General: No focal deficit present.     Mental Status: She is alert and oriented to person, place, and time.  Psychiatric:        Mood and Affect: Mood normal.        Behavior: Behavior normal.        Thought Content: Thought content normal.    BP (!) 142/78    Pulse 81    Temp (!) 97.1 F (36.2 C) (Temporal)    Ht 5' (1.524 m)    Wt 114 lb (51.7 kg)    SpO2 98%    BMI 22.26 kg/m  Wt Readings from Last 3 Encounters:  09/26/21 114 lb (51.7 kg)  01/10/21 117 lb 4 oz (53.2 kg)  10/14/19 118 lb 8 oz (53.8 kg)     Health Maintenance Due  Topic Date Due   COVID-19 Vaccine (3 - Pfizer risk series) 11/29/2019   INFLUENZA VACCINE  04/04/2021    There are no preventive care reminders to display for this patient.  Lab Results  Component Value Date   TSH 1.41 08/04/2016   Lab Results  Component Value Date   WBC 14.6 (H) 01/08/2017   HGB 14.8 01/08/2017   HCT 44.2 01/08/2017   MCV 88.2 01/08/2017   PLT 234 01/08/2017   Lab Results  Component Value Date   NA 136 01/08/2017   K 3.7 01/08/2017   CO2 23 01/08/2017   GLUCOSE 142 (H) 01/08/2017   BUN 16 01/08/2017   CREATININE 0.75 01/08/2017   BILITOT 1.0 01/08/2017   ALKPHOS 35 (L) 01/08/2017   AST 24 01/08/2017   ALT 14 01/08/2017   PROT 7.8 01/08/2017   ALBUMIN 4.7 01/08/2017   CALCIUM 10.0 01/08/2017   ANIONGAP 11 01/08/2017   GFR 87.66 08/04/2016   No results found for: HGBA1C    Assessment & Plan:   Problem List  Items Addressed This Visit       Other   Leukocytosis    This was 4 years ago however we will repeat CBC today.  Pending results      Relevant Orders   CBC w/Diff   Urinalysis w microscopic + reflex cultur   Acute  right hip pain - Primary    Unable to replicate the pain on exam however you can tell the patient is in pain as she appears to be uncomfortable.  Ordering right hip x-ray as well as given prescription for naproxen and Skelaxin as needed.  Also advised she can get over-the-counter lidocaine patches to help as needed.  If she does not have improvement and/or the x-ray does not say anything we will continue ongoing follow-up.  Have also ordered a CBC and a urinalysis to rule out any form of infection.      Relevant Medications   metaxalone (SKELAXIN) 800 MG tablet   naproxen (NAPROSYN) 500 MG tablet   Other Relevant Orders   DG HIP UNILAT WITH PELVIS 2-3 VIEWS RIGHT   CBC w/Diff   Hyperglycemia    Patient with no recent lab work however history of hyperglycemia on lab work.  We will repeat CMP and also assess kidney function      Relevant Orders   Comprehensive metabolic panel    Meds ordered this encounter  Medications   metaxalone (SKELAXIN) 800 MG tablet    Sig: Take one po qhs prn muscle spasm    Dispense:  20 tablet    Refill:  0    Order Specific Question:   Supervising Provider    Answer:   BEDSOLE, AMY E [[id number]]   naproxen (NAPROSYN) 500 MG tablet    Sig: Take 1 tablet (500 mg total) by mouth 2 (two) times daily as needed for moderate pain.    Dispense:  30 tablet    Refill:  0    Order Specific Question:   Supervising Provider    Answer:   Diona Browner, AMY E [[id number]]    Follow-up: Return in about 3 months (around 12/25/2021) for with PCP for regular follow up/ overdue.    Eugenia Pancoast, FNP

## 2021-09-27 ENCOUNTER — Telehealth: Payer: Self-pay | Admitting: Family

## 2021-09-27 LAB — URINALYSIS W MICROSCOPIC + REFLEX CULTURE
Bacteria, UA: NONE SEEN /HPF
Bilirubin Urine: NEGATIVE
Glucose, UA: NEGATIVE
Hgb urine dipstick: NEGATIVE
Hyaline Cast: NONE SEEN /LPF
Ketones, ur: NEGATIVE
Leukocyte Esterase: NEGATIVE
Nitrites, Initial: NEGATIVE
Protein, ur: NEGATIVE
RBC / HPF: NONE SEEN /HPF (ref 0–2)
Specific Gravity, Urine: 1.006 (ref 1.001–1.035)
Squamous Epithelial / HPF: NONE SEEN /HPF (ref <=5)
WBC, UA: NONE SEEN /HPF (ref 0–5)
pH: 5.5 (ref 5.0–8.0)

## 2021-09-27 LAB — NO CULTURE INDICATED

## 2021-09-27 NOTE — Telephone Encounter (Signed)
Patient called and informed that Xray and labs looked good. Patient informed me that she was doing better. Patient stated appreciation for the call.

## 2021-09-27 NOTE — Telephone Encounter (Signed)
Patient returned call from [place].  Please call back to discuss recent lab results.  Thank you

## 2021-09-27 NOTE — Progress Notes (Signed)
Please let pt know her hip xray was negative, how is the pain?

## 2021-09-27 NOTE — Progress Notes (Signed)
Also labs were acceptable and urine was clean, no sign of infection. How is patient feeling?

## 2021-12-02 DIAGNOSIS — H43813 Vitreous degeneration, bilateral: Secondary | ICD-10-CM | POA: Diagnosis not present

## 2021-12-26 ENCOUNTER — Ambulatory Visit: Payer: PPO | Admitting: Family Medicine

## 2022-01-31 ENCOUNTER — Telehealth: Payer: Self-pay

## 2022-01-31 NOTE — Telephone Encounter (Signed)
This nurse attempted to call patient three times for telephonic AWV. Message left that we will call back to reschedule for another time.

## 2022-02-16 ENCOUNTER — Telehealth: Payer: Self-pay | Admitting: Family Medicine

## 2022-02-16 NOTE — Telephone Encounter (Signed)
Spoke with patient to schedule Medicare Annual Wellness Visit (AWV) either virtually or phone   Last AWV 04/07/20  Patient wanted a call back her mom in hospital right now   I left my direct # [phone]

## 2022-03-17 ENCOUNTER — Telehealth: Payer: Self-pay | Admitting: Family Medicine

## 2022-03-17 NOTE — Telephone Encounter (Signed)
Spoke with patient to schedule  AWV  She stated she would call back when she is ready to schedule

## 2022-09-07 ENCOUNTER — Ambulatory Visit (INDEPENDENT_AMBULATORY_CARE_PROVIDER_SITE_OTHER): Payer: PPO | Admitting: Family Medicine

## 2022-09-07 ENCOUNTER — Encounter: Payer: Self-pay | Admitting: Family Medicine

## 2022-09-07 VITALS — BP 126/68 | HR 77 | Temp 97.5°F | Ht 60.0 in | Wt 118.2 lb

## 2022-09-07 DIAGNOSIS — J069 Acute upper respiratory infection, unspecified: Secondary | ICD-10-CM | POA: Diagnosis not present

## 2022-09-07 LAB — POC COVID19 BINAXNOW: SARS Coronavirus 2 Ag: NEGATIVE

## 2022-09-07 MED ORDER — BENZONATATE 200 MG PO CAPS: 200.0000 mg | ORAL_CAPSULE | Freq: Three times a day (TID) | ORAL | 1 refills | Status: DC | PRN

## 2022-09-07 NOTE — Patient Instructions (Addendum)
Drink fluids and rest  mucinex DM is good for cough and congestion  Nasal saline for congestion as needed  Tylenol for fever or pain or headache  Please alert [place] if symptoms worsen (if severe or short of breath please go to the ER)   I sent in some tessalon for cough that will help also    Update if not starting to improve in a week or if worsening

## 2022-09-07 NOTE — Assessment & Plan Note (Signed)
Day 5  Neg covid test today  Disc sympt care-see AVS Tessalon for cough also sent in  Handouts given ER precautions noted   Will watch for worse cough/wheezing/fever  Update if not starting to improve in a week or if worsening   Meds ordered this encounter  Medications   benzonatate (TESSALON) 200 MG capsule    Sig: Take 1 capsule (200 mg total) by mouth 3 (three) times daily as needed for cough. Swallow whole    Dispense:  30 capsule    Refill:  1

## 2022-09-07 NOTE — Progress Notes (Signed)
Subjective:    Patient ID: Lori Peters, female    DOB: 09-29-1945, 77 y.o.   MRN: [id number]  HPI Pt presents with c/o cough   Wt Readings from Last 3 Encounters:  09/07/22 118 lb 4 oz (53.6 kg)  09/26/21 114 lb (51.7 kg)  01/10/21 117 lb 4 oz (53.2 kg)   23.09 kg/m  Vitals:   09/07/22 0849  BP: 126/68  Pulse: 77  Temp: (!) 97.5 F (36.4 C)  TempSrc: Temporal  SpO2: 99%  Weight: 118 lb 4 oz (53.6 kg)  Height: 5' (1.524 m)   Cold symptoms started Sunday  Monday-coughing and lot of phlegm - light green   Little congestion No fever  Ears are ok  Throat is a little scratchy for pnd and cough   No wheezing  No sob   Did not do a covid test at home  No sick contacts that she knows of particularly except one child   Otc:  Husband gave her some nyquil - tried last night and it did not work   No nasal sprays No allergy medicines   Results for orders placed or performed in visit on 09/07/22  POC COVID-19 BinaxNow  Result Value Ref Range   SARS Coronavirus 2 Ag Negative Negative    Patient Active Problem List   Diagnosis Date Noted   Leukocytosis 09/26/2021   Acute right hip pain 09/26/2021   Hyperglycemia 09/26/2021   Stress reaction 01/01/2017   Tinnitus 01/01/2017   Viral URI with cough 01/02/2014   Enterocolitis 04/30/2013   GERD 04/17/2008   HYPERCHOLESTEROLEMIA 04/03/2007   OSTEOARTHRITIS 04/03/2007   Osteopenia 04/03/2007   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   DVT, HX OF 04/03/2007   Past Medical History:  Diagnosis Date   Anxiety    Arthritis    Cancer ([facility])    colon   Colorectal cancer ([facility]) 10/14/2019   Depression    GERD (gastroesophageal reflux disease)    Palpitations    Past Surgical History:  Procedure Laterality Date   APPENDECTOMY     BREAST SURGERY     BIOPSY   CATARACT EXTRACTION W/PHACO Right 11/12/2018   Procedure: CATARACT EXTRACTION PHACO AND INTRAOCULAR LENS PLACEMENT ([place]), RIGHT;  Surgeon:  Birder Robson, MD;  Location: [facility];  Service: Ophthalmology;  Laterality: Right;  [place] 00:59.2 CDE 11.21 Fluid pack Lot # [id number]   CATARACT EXTRACTION W/PHACO Left 01/21/2019   Procedure: CATARACT EXTRACTION PHACO AND INTRAOCULAR LENS PLACEMENT ([place]) LEFT;  Surgeon: Birder Robson, MD;  Location: [facility];  Service: Ophthalmology;  Laterality: Left;   CHOLECYSTECTOMY     COLON SURGERY     OSTOMY TAKE DOWN     Social History   Tobacco Use   Smoking status: Never   Smokeless tobacco: Never  Substance Use Topics   Alcohol use: No    Alcohol/week: 0.0 standard drinks of alcohol   Drug use: No   History reviewed. No pertinent family history. Allergies  Allergen Reactions   Fosamax [Alendronate Sodium] Other (See Comments)    GI side eff     Garlic Rash   Current Outpatient Medications on File Prior to Visit  Medication Sig Dispense Refill   Calcium Carbonate-Vitamin D (CALTRATE 600+D PO) Take 1 tablet by mouth daily.     metaxalone (SKELAXIN) 800 MG tablet Take one po qhs prn muscle spasm 20 tablet 0   naproxen (NAPROSYN) 500 MG tablet Take 1 tablet (500 mg total) by  mouth 2 (two) times daily as needed for moderate pain. 30 tablet 0   No current facility-administered medications on file prior to visit.    Review of Systems  Constitutional:  Positive for appetite change and fatigue. Negative for fever.  HENT:  Positive for congestion, postnasal drip, rhinorrhea, sinus pressure, sneezing and sore throat. Negative for ear pain.   Eyes:  Negative for pain and discharge.  Respiratory:  Positive for cough. Negative for shortness of breath, wheezing and stridor.   Cardiovascular:  Negative for chest pain.  Gastrointestinal:  Negative for diarrhea, nausea and vomiting.  Genitourinary:  Negative for frequency, hematuria and urgency.  Musculoskeletal:  Negative for arthralgias and myalgias.  Skin:  Negative for rash.  Neurological:  Positive for headaches. Negative  for dizziness, weakness and light-headedness.  Psychiatric/Behavioral:  Negative for confusion and dysphoric mood.        Objective:   Physical Exam Constitutional:      General: She is not in acute distress.    Appearance: Normal appearance. She is well-developed and normal weight. She is not ill-appearing, toxic-appearing or diaphoretic.  HENT:     Head: Normocephalic and atraumatic.     Comments: Nares are injected and congested    No facial tenderness    Right Ear: Tympanic membrane, ear canal and external ear normal.     Left Ear: Tympanic membrane, ear canal and external ear normal.     Nose: Congestion and rhinorrhea present.     Mouth/Throat:     Mouth: Mucous membranes are moist.     Pharynx: Oropharynx is clear. No oropharyngeal exudate or posterior oropharyngeal erythema.     Comments: Clear pnd  Eyes:     General:        Right eye: No discharge.        Left eye: No discharge.     Conjunctiva/sclera: Conjunctivae normal.     Pupils: Pupils are equal, round, and reactive to light.  Cardiovascular:     Rate and Rhythm: Normal rate.     Heart sounds: Normal heart sounds.  Pulmonary:     Effort: Pulmonary effort is normal. No respiratory distress.     Breath sounds: Normal breath sounds. No stridor. No wheezing, rhonchi or rales.     Comments: Good air exch No wheeze even on forced exp   Cough sounds junky   Chest:     Chest wall: No tenderness.  Musculoskeletal:     Cervical back: Normal range of motion and neck supple.  Lymphadenopathy:     Cervical: No cervical adenopathy.  Skin:    General: Skin is warm and dry.     Capillary Refill: Capillary refill takes less than 2 seconds.     Findings: No rash.  Neurological:     Mental Status: She is alert.     Cranial Nerves: No cranial nerve deficit.  Psychiatric:        Mood and Affect: Mood normal.           Assessment & Plan:   Problem List Items Addressed This Visit       Respiratory   Viral  URI with cough - Primary    Day 5  Neg covid test today  Disc sympt care-see AVS Tessalon for cough also sent in  Handouts given ER precautions noted   Will watch for worse cough/wheezing/fever  Update if not starting to improve in a week or if worsening   Meds ordered this encounter  Medications  benzonatate (TESSALON) 200 MG capsule    Sig: Take 1 capsule (200 mg total) by mouth 3 (three) times daily as needed for cough. Swallow whole    Dispense:  30 capsule    Refill:  1        Relevant Orders   POC COVID-19 BinaxNow (Completed)

## 2022-12-11 DIAGNOSIS — H43813 Vitreous degeneration, bilateral: Secondary | ICD-10-CM | POA: Diagnosis not present

## 2022-12-11 DIAGNOSIS — H26492 Other secondary cataract, left eye: Secondary | ICD-10-CM | POA: Diagnosis not present

## 2022-12-11 DIAGNOSIS — Z961 Presence of intraocular lens: Secondary | ICD-10-CM | POA: Diagnosis not present

## 2023-04-18 DIAGNOSIS — N760 Acute vaginitis: Secondary | ICD-10-CM | POA: Diagnosis not present

## 2023-04-24 ENCOUNTER — Ambulatory Visit (INDEPENDENT_AMBULATORY_CARE_PROVIDER_SITE_OTHER): Payer: PPO | Admitting: Family Medicine

## 2023-04-24 ENCOUNTER — Encounter: Payer: Self-pay | Admitting: Family Medicine

## 2023-04-24 VITALS — BP 118/74 | HR 72 | Temp 97.9°F | Ht 59.75 in | Wt 115.4 lb

## 2023-04-24 DIAGNOSIS — R7309 Other abnormal glucose: Secondary | ICD-10-CM

## 2023-04-24 DIAGNOSIS — D72829 Elevated white blood cell count, unspecified: Secondary | ICD-10-CM

## 2023-04-24 DIAGNOSIS — E7800 Pure hypercholesterolemia, unspecified: Secondary | ICD-10-CM

## 2023-04-24 DIAGNOSIS — M8580 Other specified disorders of bone density and structure, unspecified site: Secondary | ICD-10-CM

## 2023-04-24 DIAGNOSIS — Z85038 Personal history of other malignant neoplasm of large intestine: Secondary | ICD-10-CM | POA: Diagnosis not present

## 2023-04-24 DIAGNOSIS — Z0000 Encounter for general adult medical examination without abnormal findings: Secondary | ICD-10-CM

## 2023-04-24 LAB — LIPID PANEL
Cholesterol: 294 mg/dL — ABNORMAL HIGH (ref 0–200)
HDL: 61 mg/dL (ref >39.00)
NonHDL: 232.89
Total CHOL/HDL Ratio: 5
Triglycerides: 262 mg/dL — ABNORMAL HIGH (ref 0.0–149.0)
VLDL: 52.4 mg/dL — ABNORMAL HIGH (ref 0.0–40.0)

## 2023-04-24 LAB — COMPREHENSIVE METABOLIC PANEL
ALT: 11 U/L (ref 0–35)
AST: 15 U/L (ref 0–37)
Albumin: 4.4 g/dL (ref 3.5–5.2)
Alkaline Phosphatase: 47 U/L (ref 39–117)
BUN: 13 mg/dL (ref 6–23)
CO2: 28 mEq/L (ref 19–32)
Calcium: 9.6 mg/dL (ref 8.4–10.5)
Chloride: 105 mEq/L (ref 96–112)
Creatinine, Ser: 0.6 mg/dL (ref 0.40–1.20)
GFR: 86.37 mL/min (ref >60.00)
Glucose, Bld: 109 mg/dL — ABNORMAL HIGH (ref 70–99)
Potassium: 5.1 mEq/L (ref 3.5–5.1)
Sodium: 140 mEq/L (ref 135–145)
Total Bilirubin: 0.5 mg/dL (ref 0.2–1.2)
Total Protein: 6.7 g/dL (ref 6.0–8.3)

## 2023-04-24 LAB — TSH: TSH: 2.47 u[IU]/mL (ref 0.35–5.50)

## 2023-04-24 LAB — CBC WITH DIFFERENTIAL/PLATELET
Basophils Absolute: 0 10*3/uL (ref 0.0–0.1)
Basophils Relative: 0.6 % (ref 0.0–3.0)
Eosinophils Absolute: 0.1 10*3/uL (ref 0.0–0.7)
Eosinophils Relative: 0.8 % (ref 0.0–5.0)
HCT: 39.5 % (ref 36.0–46.0)
Hemoglobin: 12.8 g/dL (ref 12.0–15.0)
Lymphocytes Relative: 41.8 % (ref 12.0–46.0)
Lymphs Abs: 2.9 10*3/uL (ref 0.7–4.0)
MCHC: 32.4 g/dL (ref 30.0–36.0)
MCV: 88.7 fl (ref 78.0–100.0)
Monocytes Absolute: 0.5 10*3/uL (ref 0.1–1.0)
Monocytes Relative: 7.9 % (ref 3.0–12.0)
Neutro Abs: 3.4 10*3/uL (ref 1.4–7.7)
Neutrophils Relative %: 48.9 % (ref 43.0–77.0)
Platelets: 247 10*3/uL (ref 150.0–400.0)
RBC: 4.46 Mil/uL (ref 3.87–5.11)
RDW: 14 % (ref 11.5–15.5)
WBC: 6.9 10*3/uL (ref 4.0–10.5)

## 2023-04-24 LAB — LDL CHOLESTEROL, DIRECT: Direct LDL: 162 mg/dL

## 2023-04-24 LAB — HEMOGLOBIN A1C: Hgb A1c MFr Bld: 6.2 % (ref 4.6–6.5)

## 2023-04-24 NOTE — Assessment & Plan Note (Signed)
Colonoscopy 2022 Per pt due again in 2025

## 2023-04-24 NOTE — Assessment & Plan Note (Signed)
Sees gyn for this  Sent for last dexa  Next one due in November  No falls or fracture Encouraged to get back on ca and D  Plans to start exercise as well with strength training

## 2023-04-24 NOTE — Patient Instructions (Addendum)
Get a flu shot in the fall  Covid shot if you want to   Try to get 1200-1500 mg of calcium per day with at least 2000 iu of vitamin D - for bone health If calcium constipates you then just take the vitamin D   Get back to walking or other cardio exercise Add some strength training to your routine, this is important for bone and brain health and can reduce your risk of falls and help your body use insulin properly and regulate weight  Light weights, exercise bands , and internet videos are a good way to start  Yoga (chair or regular), machines , floor exercises or a gym with machines are also good options    For cholesterol Avoid red meat/ fried foods/ egg yolks/ fatty breakfast meats/ butter, cheese and high fat dairy/ and shellfish   To prevent diabetes Try to get most of your carbohydrates from produce (with the exception of white potatoes) and whole grains Eat less bread/pasta/rice/snack foods/cereals/sweets and other items from the middle of the grocery store (processed carbs)  Labs today

## 2023-04-24 NOTE — Assessment & Plan Note (Signed)
A1c added to labs today disc imp of low glycemic diet and wt loss to prevent DM2

## 2023-04-24 NOTE — Assessment & Plan Note (Signed)
Reviewed health habits including diet and exercise and skin cancer prevention Reviewed appropriate screening tests for age  Also reviewed health mt list, fam hx and immunization status , as well as social and family history   See HPI Labs reviewed and ordered Plans flu shot in fall  Sent for last mammogram-next one due in nov at [facility] for last pap and dexa  No falls or fracture Discussed bone health  PHQ 0  Encouraged to use sun protection

## 2023-04-24 NOTE — Assessment & Plan Note (Addendum)
Disc goals for lipids and reasons to control them Rev last labs with pt Rev low sat fat diet in detail   Lab today  Diet is fair  Handout given   Addendum Cholesterol elevated Will try crestor 10 mg daily (reviewed side effects) and re check in 6 wk

## 2023-04-24 NOTE — Progress Notes (Signed)
Subjective:    Patient ID: Lori Peters, female    DOB: 1945-11-11, 77 y.o.   MRN: [id number]  HPI  Here for health maintenance exam and to review chronic medical problems   Wt Readings from Last 3 Encounters:  04/24/23 115 lb 6 oz (52.3 kg)  09/07/22 118 lb 4 oz (53.6 kg)  09/26/21 114 lb (51.7 kg)   22.72 kg/m  Vitals:   04/24/23 0928  BP: 118/74  Pulse: 72  Temp: 97.9 F (36.6 C)  SpO2: 97%    Immunization History  Administered Date(s) Administered   Fluad Quad(high Dose 65+) 08/09/2020   Influenza Split 08/04/2011   Influenza Whole 06/02/2008, 06/24/2009   Influenza, High Dose Seasonal PF 06/11/2017, 06/28/2018, 05/28/2019   Influenza,inj,Quad PF,6+ Mos 04/30/2013, 06/16/2014, 07/20/2015, 06/08/2016   PFIZER(Purple Top)SARS-COV-2 Vaccination 10/11/2019, 11/01/2019   Pneumococcal Conjugate-13 08/04/2016   Pneumococcal Polysaccharide-23 04/30/2013   Td 01/10/2021   Zoster Recombinant(Shingrix) 07/28/2019, 09/14/2019   Zoster, Live 02/04/2013    Health Maintenance Due  Topic Date Due   COVID-19 Vaccine (3 - Pfizer risk series) 11/29/2019   Medicare Annual Wellness (AWV)  04/07/2021   INFLUENZA VACCINE  04/05/2023   Feeling good overall    Flu shot - get in the fall   Mammogram- had one scheduled with Dr Marcelle Overlie in November  Self breast exam  Gyn health-sees gyn  Was treated for yeast   Tongue looks white at times Little sore throat and hoarse occational  No allergies that she knows of     Colon cancer screening -colonoscopy 11/2020 Personal history of colon cancer  Will have next colonoscopy in 2025     Bone health  Dexa  05/2016 -will get in November at [facility]- none Fractures-none  Supplements -has not been taking ca or D  Exercise : was walking but not for a while  Daughter has a gym set up for her   Her mother passed so now she has more time to exercise  Was 77 years old    Mood    04/24/2023    9:31 AM 09/26/2021    9:40 AM  04/07/2020    8:19 AM 08/04/2016    2:18 PM 04/30/2013   11:48 AM  Depression screen PHQ 2/9  Decreased Interest 0 0 0 0 0  Down, Depressed, Hopeless 0 0 0 0 0  PHQ - 2 Score 0 0 0 0 0  Altered sleeping 0  0    Tired, decreased energy 0  0    Change in appetite 0  0    Feeling bad or failure about yourself  0  0    Trouble concentrating 0  0    Moving slowly or fidgety/restless 0  0    Suicidal thoughts 0  0    PHQ-9 Score 0  0    Difficult doing work/chores Not difficult at all  Not difficult at all     Hyperlipidemia Lab Results  Component Value Date   CHOL 256 (H) 08/04/2016   HDL 72.00 08/04/2016   LDLCALC 150 (H) 08/04/2016   LDLDIRECT 144.9 04/17/2008   TRIG 170.0 (H) 08/04/2016   CHOLHDL 4 08/04/2016   Due for labs   Eating a balanced diet overall Watches the cholesterol a bit- could do better   Not much sugar     Patient Active Problem List   Diagnosis Date Noted   Leukocytosis 09/26/2021   Elevated glucose level 09/26/2021   Tinnitus 01/01/2017  Routine general medical examination at a health care facility 08/03/2016   HYPERCHOLESTEROLEMIA 04/03/2007   OSTEOARTHRITIS 04/03/2007   Osteopenia 04/03/2007   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   DVT, HX OF 04/03/2007   Past Medical History:  Diagnosis Date   Anxiety    Arthritis    Cancer ([facility])    colon   Colorectal cancer ([facility]) 10/14/2019   Depression    GERD (gastroesophageal reflux disease)    Palpitations    Past Surgical History:  Procedure Laterality Date   APPENDECTOMY     BREAST SURGERY     BIOPSY   CATARACT EXTRACTION W/PHACO Right 11/12/2018   Procedure: CATARACT EXTRACTION PHACO AND INTRAOCULAR LENS PLACEMENT ([facility]), RIGHT;  Surgeon: Galen Manila, MD;  Location: [facility];  Service: Ophthalmology;  Laterality: Right;  [place] 00:59.2 CDE 11.21 Fluid pack Lot # [id number]   CATARACT EXTRACTION W/PHACO Left 01/21/2019   Procedure: CATARACT EXTRACTION PHACO AND INTRAOCULAR  LENS PLACEMENT ([facility]) LEFT;  Surgeon: Galen Manila, MD;  Location[facility];  Service: Ophthalmology;  Laterality: Left;   CHOLECYSTECTOMY     COLON SURGERY     OSTOMY TAKE DOWN     Social History   Tobacco Use   Smoking status: Never   Smokeless tobacco: Never  Substance Use Topics   Alcohol use: No    Alcohol/week: 0.0 standard drinks of alcohol   Drug use: No   History reviewed. No pertinent family history. Allergies  Allergen Reactions   Fosamax [Alendronate Sodium] Other (See Comments)    GI side eff     Garlic Rash   Current Outpatient Medications on File Prior to Visit  Medication Sig Dispense Refill   nystatin cream (MYCOSTATIN) Apply 1 Application topically 2 (two) times daily.     triamcinolone cream (KENALOG) 0.1 % Apply 1 Application topically 2 (two) times daily.     No current facility-administered medications on file prior to visit.    Review of Systems  Constitutional:  Negative for activity change, appetite change, fatigue, fever and unexpected weight change.  HENT:  Negative for congestion, ear pain, rhinorrhea, sinus pressure and sore throat.        Wonders if she gets oral thrush from time to time  No symptoms today  Eyes:  Negative for pain, redness and visual disturbance.  Respiratory:  Negative for cough, shortness of breath and wheezing.   Cardiovascular:  Negative for chest pain and palpitations.  Gastrointestinal:  Negative for abdominal pain, blood in stool, constipation and diarrhea.  Endocrine: Negative for polydipsia and polyuria.  Genitourinary:  Negative for dysuria, frequency and urgency.       Had a vaginal yeast infection- treated   Musculoskeletal:  Negative for arthralgias, back pain and myalgias.  Skin:  Negative for pallor and rash.  Allergic/Immunologic: Negative for environmental allergies.  Neurological:  Negative for dizziness, syncope and headaches.  Hematological:  Negative for adenopathy. Does not bruise/bleed  easily.  Psychiatric/Behavioral:  Negative for decreased concentration and dysphoric mood. The patient is not nervous/anxious.        Objective:   Physical Exam Constitutional:      General: She is not in acute distress.    Appearance: Normal appearance. She is well-developed and normal weight. She is not ill-appearing or diaphoretic.  HENT:     Head: Normocephalic and atraumatic.     Right Ear: Tympanic membrane, ear canal and external ear normal.     Left Ear: Tympanic membrane, ear canal  and external ear normal.     Nose: Nose normal. No congestion.     Mouth/Throat:     Mouth: Mucous membranes are moist.     Pharynx: Oropharynx is clear. No posterior oropharyngeal erythema.  Eyes:     General: No scleral icterus.    Extraocular Movements: Extraocular movements intact.     Conjunctiva/sclera: Conjunctivae normal.     Pupils: Pupils are equal, round, and reactive to light.  Neck:     Thyroid: No thyromegaly.     Vascular: No carotid bruit or JVD.  Cardiovascular:     Rate and Rhythm: Normal rate and regular rhythm.     Pulses: Normal pulses.     Heart sounds: Normal heart sounds.     No gallop.  Pulmonary:     Effort: Pulmonary effort is normal. No respiratory distress.     Breath sounds: Normal breath sounds. No wheezing.     Comments: Good air exch Chest:     Chest wall: No tenderness.  Abdominal:     General: Bowel sounds are normal. There is no distension or abdominal bruit.     Palpations: Abdomen is soft. There is no mass.     Tenderness: There is no abdominal tenderness.     Hernia: No hernia is present.  Genitourinary:    Comments: Breast and pelvic exam are done by gyn provider   Musculoskeletal:        General: No tenderness. Normal range of motion.     Cervical back: Normal range of motion and neck supple. No rigidity. No muscular tenderness.     Right lower leg: No edema.     Left lower leg: No edema.     Comments: No kyphosis   Lymphadenopathy:      Cervical: No cervical adenopathy.  Skin:    General: Skin is warm and dry.     Coloration: Skin is not pale.     Findings: No erythema or rash.     Comments: Solar lentigines diffusely Some sks  Neurological:     Mental Status: She is alert. Mental status is at baseline.     Cranial Nerves: No cranial nerve deficit.     Motor: No abnormal muscle tone.     Coordination: Coordination normal.     Gait: Gait normal.     Deep Tendon Reflexes: Reflexes are normal and symmetric. Reflexes normal.  Psychiatric:        Mood and Affect: Mood normal.        Cognition and Memory: Cognition and memory normal.           Assessment & Plan:   Problem List Items Addressed This Visit       Musculoskeletal and Integument   Osteopenia    Sees gyn for this  Sent for last dexa  Next one due in November  No falls or fracture Encouraged to get back on ca and D  Plans to start exercise as well with strength training         Other   Routine general medical examination at a health care facility - Primary    Reviewed health habits including diet and exercise and skin cancer prevention Reviewed appropriate screening tests for age  Also reviewed health mt list, fam hx and immunization status , as well as social and family history   See HPI Labs reviewed and ordered Plans flu shot in fall  Sent for last mammogram-next one due in nov at [facility]  for last pap and dexa  No falls or fracture Discussed bone health  PHQ 0  Encouraged to use sun protection       Leukocytosis    Cbc today  History of colon cancer in past       Relevant Orders   CBC with Differential/Platelet   HYPERCHOLESTEROLEMIA    Disc goals for lipids and reasons to control them Rev last labs with pt Rev low sat fat diet in detail   Lab today  Diet is fair  Handout given        Relevant Orders   TSH   Lipid panel   Comprehensive metabolic panel   History of malignant neoplasm of large intestine     Colonoscopy 2022 Per pt due again in 2025        Elevated glucose level    A1c added to labs today disc imp of low glycemic diet and wt loss to prevent DM2       Relevant Orders   Comprehensive metabolic panel   Hemoglobin A1c

## 2023-04-24 NOTE — Assessment & Plan Note (Signed)
Cbc today  History of colon cancer in past

## 2023-04-26 ENCOUNTER — Encounter: Payer: Self-pay | Admitting: Obstetrics and Gynecology

## 2023-04-29 MED ORDER — ROSUVASTATIN CALCIUM 10 MG PO TABS: 10.0000 mg | ORAL_TABLET | Freq: Every day | ORAL | 3 refills | Status: DC

## 2023-04-29 NOTE — Addendum Note (Signed)
Addended by: Roxy Manns A on: 04/29/2023 12:58 PM   Modules accepted: Orders

## 2023-05-01 NOTE — Addendum Note (Signed)
Addended by: Roxy Manns A on: 05/01/2023 07:20 PM   Modules accepted: Orders

## 2023-05-09 DIAGNOSIS — N762 Acute vulvitis: Secondary | ICD-10-CM | POA: Diagnosis not present

## 2023-05-10 ENCOUNTER — Ambulatory Visit (INDEPENDENT_AMBULATORY_CARE_PROVIDER_SITE_OTHER): Payer: PPO

## 2023-05-10 VITALS — Ht 60.0 in | Wt 115.6 lb

## 2023-05-10 DIAGNOSIS — Z0000 Encounter for general adult medical examination without abnormal findings: Secondary | ICD-10-CM | POA: Diagnosis not present

## 2023-05-10 NOTE — Progress Notes (Signed)
Subjective:   Lori Peters is a 77 y.o. female who presents for Medicare Annual (Subsequent) preventive examination.  Visit Complete: Virtual  I connected with  Lori Peters on 05/10/23 by a audio enabled telemedicine application and verified that I am speaking with the correct person using two identifiers.  Patient Location: Home  Provider Location: Office/Clinic  I discussed the limitations of evaluation and management by telemedicine. The patient expressed understanding and agreed to proceed.  Vital Signs: Because this visit was a virtual/telehealth visit, some criteria may be missing or patient reported. Any vitals not documented were not able to be obtained and vitals that have been documented are patient reported.    Review of Systems      Cardiac Risk Factors include: advanced age (>30men, >9 women);sedentary lifestyle     Objective:    Today's Vitals   05/10/23 1015  Weight: 115 lb 9.6 oz (52.4 kg)  Height: 5' (1.524 m)   Body mass index is 22.58 kg/m.     05/10/2023   10:21 AM 04/07/2020    8:17 AM 01/21/2019   10:15 AM 01/07/2017   11:52 PM 08/04/2016    2:29 PM 04/22/2015   12:06 PM  Advanced Directives  Does Patient Have a Medical Advance Directive? No No No No No No  Would patient like information on creating a medical advance directive? No - Patient declined No - Patient declined Yes (MAU/Ambulatory/Procedural Areas - Information given)   No - patient declined information    Current Medications (verified) Outpatient Encounter Medications as of 05/10/2023  Medication Sig   Calcium Carb-Cholecalciferol (RA CALCIUM PLUS VITAMIN D PO) Take by mouth.   nystatin cream (MYCOSTATIN) Apply 1 Application topically 2 (two) times daily.   rosuvastatin (CRESTOR) 10 MG tablet Take 1 tablet (10 mg total) by mouth daily.   triamcinolone cream (KENALOG) 0.1 % Apply 1 Application topically 2 (two) times daily.   No facility-administered encounter medications on file as of  05/10/2023.    Allergies (verified) Fosamax [alendronate sodium] and Garlic   History: Past Medical History:  Diagnosis Date   Anxiety    Arthritis    Cancer (HCC)    colon   Colorectal cancer (HCC) 10/14/2019   Depression    GERD (gastroesophageal reflux disease)    Palpitations    Past Surgical History:  Procedure Laterality Date   APPENDECTOMY     BREAST SURGERY     BIOPSY   CATARACT EXTRACTION W/PHACO Right 11/12/2018   Procedure: CATARACT EXTRACTION PHACO AND INTRAOCULAR LENS PLACEMENT (IOC), RIGHT;  Surgeon: Galen Manila, MD;  Location: [facility];  Service: Ophthalmology;  Laterality: Right;  [place] 00:59.2 CDE 11.21 Fluid pack Lot # [id number]   CATARACT EXTRACTION W/PHACO Left 01/21/2019   Procedure: CATARACT EXTRACTION PHACO AND INTRAOCULAR LENS PLACEMENT (IOC) LEFT;  Surgeon: Galen Manila, MD;  Location: [facility];  Service: Ophthalmology;  Laterality: Left;   CHOLECYSTECTOMY     COLON SURGERY     OSTOMY TAKE DOWN     History reviewed. No pertinent family history. Social History   Socioeconomic History   Marital status: Married    Spouse name: Not on file   Number of children: Not on file   Years of education: Not on file   Highest education level: Not on file  Occupational History   Not on file  Tobacco Use   Smoking status: Never   Smokeless tobacco: Never  Substance and Sexual Activity   Alcohol use: No  Alcohol/week: 0.0 standard drinks of alcohol   Drug use: No   Sexual activity: Not on file  Other Topics Concern   Not on file  Social History Narrative   Not on file   Social Determinants of Health   Financial Resource Strain: Low Risk  (05/10/2023)   Overall Financial Resource Strain (CARDIA)    Difficulty of Paying Living Expenses: Not hard at all  Food Insecurity: No Food Insecurity (05/10/2023)   Hunger Vital Sign    Worried About Running Out of Food in the Last Year: Never true    Ran Out of Food in the Last Year: Never true   Transportation Needs: No Transportation Needs (05/10/2023)   PRAPARE - Administrator, Civil Service (Medical): No    Lack of Transportation (Non-Medical): No  Physical Activity: Sufficiently Active (05/10/2023)   Exercise Vital Sign    Days of Exercise per Week: 3 days    Minutes of Exercise per Session: 60 min  Stress: No Stress Concern Present (05/10/2023)   [employer] of Occupational Health - Occupational Stress Questionnaire    Feeling of Stress : Not at all  Social Connections: Moderately Integrated (05/10/2023)   Social Connection and Isolation Panel [NHANES]    Frequency of Communication with Friends and Family: More than three times a week    Frequency of Social Gatherings with Friends and Family: More than three times a week    Attends Religious Services: More than 4 times per year    Active Member of [employer] or Organizations: No    Attends Engineer, structural: Never    Marital Status: Married    Tobacco Counseling Counseling given: Not Answered   Clinical Intake:  Pre-visit preparation completed: Yes  Pain : No/denies pain     BMI - recorded: 22.58 Nutritional Status: BMI of 19-24  Normal Nutritional Risks: None Diabetes: No  How often do you need to have someone help you when you read instructions, pamphlets, or other written materials from your doctor or pharmacy?: 1 - Never  Interpreter Needed?: No  Information entered by :: C.Hadia Minier LPN   Activities of Daily Living    05/10/2023   10:22 AM  In your present state of health, do you have any difficulty performing the following activities:  Hearing? 0  Vision? 0  Difficulty concentrating or making decisions? 0  Walking or climbing stairs? 0  Dressing or bathing? 0  Doing errands, shopping? 0  Preparing Food and eating ? N  Using the Toilet? N  In the past six months, have you accidently leaked urine? N  Do you have problems with loss of bowel control? N  Managing your  Medications? N  Managing your Finances? N  Housekeeping or managing your Housekeeping? N    Patient Care Team: Tower, Audrie Gallus, MD as PCP - General Tower, Audrie Gallus, MD Richarda Overlie, MD as Consulting Physician (Obstetrics and Gynecology) Sallee Lange, MD as Consulting Physician (Ophthalmology) Josepha Pigg, DMD as Consulting Physician (Dentistry)  Indicate any recent Medical Services you may have received from other than [facility] providers in the past year (date may be approximate).     Assessment:   This is a routine wellness examination for Lori Peters.  Hearing/Vision screen Hearing Screening - Comments:: Denies hearing difficulties   Vision Screening - Comments:: Glasses - Dr.Porfilio - UTD on eye exams.   Goals Addressed             This Visit's Progress  Patient Stated       Increase exercise and make smart food choices.       Depression Screen    05/10/2023   10:17 AM 04/24/2023    9:31 AM 09/26/2021    9:40 AM 04/07/2020    8:19 AM 08/04/2016    2:18 PM 04/30/2013   11:48 AM  PHQ 2/9 Scores  PHQ - 2 Score 0 0 0 0 0 0  PHQ- 9 Score 0 0  0      Fall Risk    05/10/2023   10:22 AM 04/24/2023    9:31 AM 09/26/2021    9:40 AM 04/07/2020    8:19 AM 03/30/2020    3:12 PM  Fall Risk   Falls in the past year? 0 0 1 0 0  Comment     Emmi Telephone Survey: data to providers prior to load  Number falls in past yr: 0 0 0 0   Injury with Fall? 0 0 0 0   Risk for fall due to : No Fall Risks No Fall Risks  No Fall Risks   Follow up Falls prevention discussed;Falls evaluation completed Falls evaluation completed  Falls evaluation completed;Falls prevention discussed     MEDICARE RISK AT HOME: Medicare Risk at Home Any stairs in or around the home?: No If so, are there any without handrails?: No Home free of loose throw rugs in walkways, pet beds, electrical cords, etc?: Yes Adequate lighting in your home to reduce risk of falls?: Yes Life alert?: No Use of a cane,  walker or w/c?: No Grab bars in the bathroom?: Yes (in shower) Shower chair or bench in shower?: Yes Elevated toilet seat or a handicapped toilet?: Yes  TIMED UP AND GO:  Was the test performed?  No    Cognitive Function:    04/07/2020    8:20 AM 08/04/2016    2:19 PM  MMSE - Mini Mental State Exam  Orientation to time 5 5  Orientation to Place 5 5  Registration 3 3  Attention/ Calculation 5 0  Recall 3 3  Language- name 2 objects  0  Language- repeat 1 1  Language- follow 3 step command  3  Language- read & follow direction  0  Write a sentence  0  Copy design  0  Total score  20        05/10/2023   10:24 AM  6CIT Screen  What Year? 0 points  What month? 0 points  What time? 0 points  Count back from 20 0 points  Months in reverse 0 points  Repeat phrase 0 points  Total Score 0 points    Immunizations Immunization History  Administered Date(s) Administered   Fluad Quad(high Dose 65+) 08/09/2020   Influenza Split 08/04/2011   Influenza Whole 06/02/2008, 06/24/2009   Influenza, High Dose Seasonal PF 06/11/2017, 06/28/2018, 05/28/2019   Influenza,inj,Quad PF,6+ Mos 04/30/2013, 06/16/2014, 07/20/2015, 06/08/2016   PFIZER(Purple Top)SARS-COV-2 Vaccination 10/11/2019, 11/01/2019   Pneumococcal Conjugate-13 08/04/2016   Pneumococcal Polysaccharide-23 04/30/2013   Td 01/10/2021   Zoster Recombinant(Shingrix) 07/28/2019, 09/14/2019   Zoster, Live 02/04/2013    TDAP status: Up to date  Flu Vaccine status: Due, Education has been provided regarding the importance of this vaccine. Advised may receive this vaccine at local pharmacy or Health Dept. Aware to provide a copy of the vaccination record if obtained from local pharmacy or Health Dept. Verbalized acceptance and understanding.  Pneumococcal vaccine status: Up to date  Covid-19 vaccine  status: Information provided on how to obtain vaccines.   Qualifies for Shingles Vaccine? Yes   Zostavax completed Yes    Shingrix Completed?: Yes  Screening Tests Health Maintenance  Topic Date Due   COVID-19 Vaccine (3 - Pfizer risk series) 11/29/2019   INFLUENZA VACCINE  04/05/2023   Medicare Annual Wellness (AWV)  05/09/2024   DTaP/Tdap/Td (2 - Tdap) 01/11/2031   Pneumonia Vaccine 50+ Years old  Completed   DEXA SCAN  Completed   Hepatitis C Screening  Completed   Zoster Vaccines- Shingrix  Completed   HPV VACCINES  Aged Out   Colonoscopy  Discontinued    Health Maintenance  Health Maintenance Due  Topic Date Due   COVID-19 Vaccine (3 - Pfizer risk series) 11/29/2019   INFLUENZA VACCINE  04/05/2023    Colorectal cancer screening: Type of screening: Colonoscopy. Completed 11/18/20. Repeat every 3 years  Mammogram status: Ordered by Dr.Holland. Pt provided with contact info and advised to call to schedule appt.   Bone Density status: Ordered Dr.Holland. Pt provided with contact info and advised to call to schedule appt.  Lung Cancer Screening: (Low Dose CT Chest recommended if Age 41-80 years, 20 pack-year currently smoking OR have quit w/in 15years.) does not qualify.   Lung Cancer Screening Referral:    Additional Screening:  Hepatitis C Screening: does qualify; Completed 08/04/16  Vision Screening: Recommended annual ophthalmology exams for early detection of glaucoma and other disorders of the eye. Is the patient up to date with their annual eye exam?  Yes  Who is the provider or what is the name of the office in which the patient attends annual eye exams? Dr.Porfilio If pt is not established with a provider, would they like to be referred to a provider to establish care? Yes .   Dental Screening: Recommended annual dental exams for proper oral hygiene  Diabetic Foot Exam:   Community Resource Referral / Chronic Care Management: CRR required this visit?  No   CCM required this visit?  No     Plan:     I have personally reviewed and noted the following in the patient's  chart:   Medical and social history Use of alcohol, tobacco or illicit drugs  Current medications and supplements including opioid prescriptions. Patient is not currently taking opioid prescriptions. Functional ability and status Nutritional status Physical activity Advanced directives List of other physicians Hospitalizations, surgeries, and ER visits in previous 12 months Vitals Screenings to include cognitive, depression, and falls Referrals and appointments  In addition, I have reviewed and discussed with patient certain preventive protocols, quality metrics, and best practice recommendations. A written personalized care plan for preventive services as well as general preventive health recommendations were provided to patient.     Maryan Puls, LPN   09/09/1094   After Visit Summary: (MyChart) Due to this being a telephonic visit, the after visit summary with patients personalized plan was offered to patient via MyChart   Nurse Notes: Pt has upcoming appointmet for mammogram and dexa scan with Dr.Hollands office. Pt will have results sent to PCP.

## 2023-05-10 NOTE — Patient Instructions (Signed)
Lori Peters , Thank you for taking time to come for your Medicare Wellness Visit. I appreciate your ongoing commitment to your health goals. Please review the following plan we discussed and let me know if I can assist you in the future.   Referrals/Orders/Follow-Ups/Clinician Recommendations: Aim for 30 minutes of exercise or brisk walking, 6-8 glasses of water, and 5 servings of fruits and vegetables each day.   This is a list of the screening recommended for you and due dates:  Health Maintenance  Topic Date Due   COVID-19 Vaccine (3 - Pfizer risk series) 11/29/2019   Medicare Annual Wellness Visit  04/07/2021   Flu Shot  04/05/2023   DTaP/Tdap/Td vaccine (2 - Tdap) 01/11/2031   Pneumonia Vaccine  Completed   DEXA scan (bone density measurement)  Completed   Hepatitis C Screening  Completed   Zoster (Shingles) Vaccine  Completed   HPV Vaccine  Aged Out   Colon Cancer Screening  Discontinued    Advanced directives: (Declined) Advance directive discussed with you today. Even though you declined this today, please call our office should you change your mind, and we can give you the proper paperwork for you to fill out.  Next Medicare Annual Wellness Visit scheduled for next year: Yes

## 2023-05-16 DIAGNOSIS — H6122 Impacted cerumen, left ear: Secondary | ICD-10-CM | POA: Diagnosis not present

## 2023-05-16 DIAGNOSIS — K219 Gastro-esophageal reflux disease without esophagitis: Secondary | ICD-10-CM | POA: Diagnosis not present

## 2023-05-16 DIAGNOSIS — R490 Dysphonia: Secondary | ICD-10-CM | POA: Diagnosis not present

## 2023-06-12 ENCOUNTER — Other Ambulatory Visit (INDEPENDENT_AMBULATORY_CARE_PROVIDER_SITE_OTHER): Payer: PPO

## 2023-06-12 DIAGNOSIS — E7800 Pure hypercholesterolemia, unspecified: Secondary | ICD-10-CM

## 2023-06-12 LAB — LIPID PANEL
Cholesterol: 147 mg/dL (ref 0–200)
HDL: 57.3 mg/dL (ref >39.00)
LDL Cholesterol: 61 mg/dL (ref 0–99)
NonHDL: 89.57
Total CHOL/HDL Ratio: 3
Triglycerides: 141 mg/dL (ref 0.0–149.0)
VLDL: 28.2 mg/dL (ref 0.0–40.0)

## 2023-06-12 LAB — AST: AST: 20 U/L (ref 0–37)

## 2023-06-12 LAB — ALT: ALT: 14 U/L (ref 0–35)

## 2023-06-13 ENCOUNTER — Encounter: Payer: Self-pay | Admitting: *Deleted

## 2023-07-12 DIAGNOSIS — Z6822 Body mass index (BMI) 22.0-22.9, adult: Secondary | ICD-10-CM | POA: Diagnosis not present

## 2023-07-12 DIAGNOSIS — Z01419 Encounter for gynecological examination (general) (routine) without abnormal findings: Secondary | ICD-10-CM | POA: Diagnosis not present

## 2023-07-30 DIAGNOSIS — N958 Other specified menopausal and perimenopausal disorders: Secondary | ICD-10-CM | POA: Diagnosis not present

## 2023-07-30 DIAGNOSIS — M8588 Other specified disorders of bone density and structure, other site: Secondary | ICD-10-CM | POA: Diagnosis not present

## 2023-07-30 DIAGNOSIS — Z1231 Encounter for screening mammogram for malignant neoplasm of breast: Secondary | ICD-10-CM | POA: Diagnosis not present

## 2023-07-30 LAB — HM DEXA SCAN

## 2023-07-30 LAB — HM MAMMOGRAPHY

## 2023-08-15 DIAGNOSIS — M810 Age-related osteoporosis without current pathological fracture: Secondary | ICD-10-CM | POA: Diagnosis not present

## 2023-10-29 ENCOUNTER — Encounter: Payer: Self-pay | Admitting: Family Medicine

## 2023-10-29 ENCOUNTER — Ambulatory Visit (INDEPENDENT_AMBULATORY_CARE_PROVIDER_SITE_OTHER): Payer: PPO | Admitting: Family Medicine

## 2023-10-29 VITALS — BP 124/76 | HR 72 | Temp 97.8°F | Ht 60.0 in | Wt 115.4 lb

## 2023-10-29 DIAGNOSIS — R7309 Other abnormal glucose: Secondary | ICD-10-CM

## 2023-10-29 DIAGNOSIS — E7800 Pure hypercholesterolemia, unspecified: Secondary | ICD-10-CM | POA: Diagnosis not present

## 2023-10-29 DIAGNOSIS — R7303 Prediabetes: Secondary | ICD-10-CM | POA: Diagnosis not present

## 2023-10-29 LAB — POCT GLYCOSYLATED HEMOGLOBIN (HGB A1C): Hemoglobin A1C: 5.7 % — AB (ref 4.0–5.6)

## 2023-10-29 NOTE — Patient Instructions (Addendum)
 Stay active Add some strength training to your routine, this is important for bone and brain health and can reduce your risk of falls and help your body use insulin properly and regulate weight  Light weights, exercise bands , and internet videos are a good way to start  Yoga (chair or regular), machines , floor exercises or a gym with machines are also good options   Think about starting a program with your 3 lb weights and gradually go up in weight as you get stronger   There are great programs on TV and on line   Try to get most of your carbohydrates from produce (with the exception of white potatoes) and whole grains Eat less bread/pasta/rice/snack foods/cereals/sweets and other items from the middle of the grocery store (processed carbs)  Both cholesterol and glucose are better  Keep up the good work

## 2023-10-29 NOTE — Assessment & Plan Note (Signed)
 Improved  Lab Results  Component Value Date   HGBA1C 5.7 (A) 10/29/2023   HGBA1C 6.2 04/24/2023   Eating less sugar and processed carbs Commended disc imp of low glycemic diet and wt loss to prevent DM2  Encouraged to start strength building exercise also

## 2023-10-29 NOTE — Assessment & Plan Note (Signed)
 Disc goals for lipids and reasons to control them Rev last labs with pt Rev low sat fat diet in detail Much imporved with crestor 10 mg daily which she tolerates well  LDL down to 61 (down over 161 points) Plan to continue this with good diet

## 2023-10-29 NOTE — Progress Notes (Signed)
 Subjective:    Patient ID: Lori Peters, female    DOB: [birth year], 78 y.o.   MRN: [id number]  HPI  Wt Readings from Last 3 Encounters:  10/29/23 115 lb 6 oz (52.3 kg)  05/10/23 115 lb 9.6 oz (52.4 kg)  04/24/23 115 lb 6 oz (52.3 kg)   22.53 kg/m  Vitals:   10/29/23 1051  BP: 124/76  Pulse: 72  Temp: 97.8 F (36.6 C)  SpO2: 98%    Pt presents for follow up of glucose level, hyperlipidemia and chronic health problems   Feels good overall     Prediabetes  Lab Results  Component Value Date   HGBA1C 5.7 (A) 10/29/2023   HGBA1C 6.2 04/24/2023   Down to 5.7 today !    Diet has changed a little  Trying to eat less sweets  No sugar drinks (occational diet drink)    Exercise - a little  Walking     Hyperlipidemia Lab Results  Component Value Date   CHOL 147 06/12/2023   HDL 57.30 06/12/2023   LDLCALC 61 06/12/2023   LDLDIRECT 162.0 04/24/2023   TRIG 141.0 06/12/2023   CHOLHDL 3 06/12/2023   Taking crestor 10 mg and it has helped  No side effects or problems   LDL was down from 162 to 61       Patient Active Problem List   Diagnosis Date Noted   Prediabetes 09/26/2021   Tinnitus 01/01/2017   Routine general medical examination at a health care facility 08/03/2016   HYPERCHOLESTEROLEMIA 04/03/2007   Osteoarthritis 04/03/2007   Osteopenia 04/03/2007   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   DVT, HX OF 04/03/2007   Past Medical History:  Diagnosis Date   Anxiety    Arthritis    Cancer (HCC)    colon   Colorectal cancer (HCC) 10/14/2019   Depression    GERD (gastroesophageal reflux disease)    Palpitations    Past Surgical History:  Procedure Laterality Date   APPENDECTOMY     BREAST SURGERY     BIOPSY   CATARACT EXTRACTION W/PHACO Right 11/12/2018   Procedure: CATARACT EXTRACTION PHACO AND INTRAOCULAR LENS PLACEMENT (IOC), RIGHT;  Surgeon: Galen Manila, MD;  Location: [facility];  Service: Ophthalmology;   Laterality: Right;  [place] 00:59.2 CDE 11.21 Fluid pack Lot # [id number]   CATARACT EXTRACTION W/PHACO Left 01/21/2019   Procedure: CATARACT EXTRACTION PHACO AND INTRAOCULAR LENS PLACEMENT (IOC) LEFT;  Surgeon: Galen Manila, MD;  Location: [facility];  Service: Ophthalmology;  Laterality: Left;   CHOLECYSTECTOMY     COLON SURGERY     OSTOMY TAKE DOWN     Social History   Tobacco Use   Smoking status: Never   Smokeless tobacco: Never  Substance Use Topics   Alcohol use: No    Alcohol/week: 0.0 standard drinks of alcohol   Drug use: No   No family history on file. Allergies  Allergen Reactions   Fosamax [Alendronate Sodium] Other (See Comments)    GI side eff     Garlic Rash   Current Outpatient Medications on File Prior to Visit  Medication Sig Dispense Refill   Calcium Carb-Cholecalciferol (RA CALCIUM PLUS VITAMIN D PO) Take by mouth.     nystatin cream (MYCOSTATIN) Apply 1 Application topically 2 (two) times daily.     rosuvastatin (CRESTOR) 10 MG tablet Take 1 tablet (10 mg total) by mouth daily. 90 tablet 3   triamcinolone cream (KENALOG) 0.1 % Apply  1 Application topically 2 (two) times daily.     No current facility-administered medications on file prior to visit.    Review of Systems  Constitutional:  Negative for activity change, appetite change, fatigue, fever and unexpected weight change.  HENT:  Negative for congestion, ear pain, rhinorrhea, sinus pressure and sore throat.   Eyes:  Negative for pain, redness and visual disturbance.  Respiratory:  Negative for cough, shortness of breath and wheezing.   Cardiovascular:  Negative for chest pain and palpitations.  Gastrointestinal:  Negative for abdominal pain, blood in stool, constipation and diarrhea.  Endocrine: Negative for polydipsia and polyuria.  Genitourinary:  Negative for dysuria, frequency and urgency.  Musculoskeletal:  Negative for arthralgias, back pain and myalgias.  Skin:  Negative for  pallor and rash.  Allergic/Immunologic: Negative for environmental allergies.  Neurological:  Negative for dizziness, syncope and headaches.  Hematological:  Negative for adenopathy. Does not bruise/bleed easily.  Psychiatric/Behavioral:  Negative for decreased concentration and dysphoric mood. The patient is not nervous/anxious.        Objective:   Physical Exam Constitutional:      General: She is not in acute distress.    Appearance: Normal appearance. She is well-developed and normal weight. She is not ill-appearing or diaphoretic.  HENT:     Head: Normocephalic and atraumatic.  Eyes:     Conjunctiva/sclera: Conjunctivae normal.     Pupils: Pupils are equal, round, and reactive to light.  Neck:     Thyroid: No thyromegaly.     Vascular: No carotid bruit or JVD.  Cardiovascular:     Rate and Rhythm: Normal rate and regular rhythm.     Heart sounds: Normal heart sounds.     No gallop.  Pulmonary:     Effort: Pulmonary effort is normal. No respiratory distress.     Breath sounds: Normal breath sounds. No wheezing or rales.  Abdominal:     General: There is no distension or abdominal bruit.     Palpations: Abdomen is soft.  Musculoskeletal:     Cervical back: Normal range of motion and neck supple.     Right lower leg: No edema.     Left lower leg: No edema.  Lymphadenopathy:     Cervical: No cervical adenopathy.  Skin:    General: Skin is warm and dry.     Coloration: Skin is not pale.     Findings: No rash.  Neurological:     Mental Status: She is alert.     Coordination: Coordination normal.     Deep Tendon Reflexes: Reflexes are normal and symmetric. Reflexes normal.  Psychiatric:        Mood and Affect: Mood normal.           Assessment & Plan:   Problem List Items Addressed This Visit       Other   Prediabetes - Primary   Improved  Lab Results  Component Value Date   HGBA1C 5.7 (A) 10/29/2023   HGBA1C 6.2 04/24/2023   Eating less sugar and  processed carbs Commended disc imp of low glycemic diet and wt loss to prevent DM2  Encouraged to start strength building exercise also       HYPERCHOLESTEROLEMIA   Disc goals for lipids and reasons to control them Rev last labs with pt Rev low sat fat diet in detail Much imporved with crestor 10 mg daily which she tolerates well  LDL down to 61 (down over 161 points) Plan to continue  this with good diet

## 2023-12-13 DIAGNOSIS — Z961 Presence of intraocular lens: Secondary | ICD-10-CM | POA: Diagnosis not present

## 2023-12-13 DIAGNOSIS — H26492 Other secondary cataract, left eye: Secondary | ICD-10-CM | POA: Diagnosis not present

## 2023-12-13 DIAGNOSIS — H43813 Vitreous degeneration, bilateral: Secondary | ICD-10-CM | POA: Diagnosis not present

## 2024-04-11 ENCOUNTER — Other Ambulatory Visit: Payer: Self-pay | Admitting: Family Medicine

## 2024-05-01 ENCOUNTER — Ambulatory Visit

## 2024-05-01 DIAGNOSIS — L570 Actinic keratosis: Secondary | ICD-10-CM | POA: Diagnosis not present

## 2024-05-01 DIAGNOSIS — L814 Other melanin hyperpigmentation: Secondary | ICD-10-CM | POA: Diagnosis not present

## 2024-05-01 DIAGNOSIS — L578 Other skin changes due to chronic exposure to nonionizing radiation: Secondary | ICD-10-CM

## 2024-05-01 DIAGNOSIS — L820 Inflamed seborrheic keratosis: Secondary | ICD-10-CM

## 2024-05-01 DIAGNOSIS — W908XXA Exposure to other nonionizing radiation, initial encounter: Secondary | ICD-10-CM | POA: Diagnosis not present

## 2024-05-01 DIAGNOSIS — L821 Other seborrheic keratosis: Secondary | ICD-10-CM | POA: Diagnosis not present

## 2024-05-01 NOTE — Patient Instructions (Addendum)

## 2024-05-01 NOTE — Progress Notes (Signed)
 New Patient Visit   Subjective  Lori Peters is a 78 y.o. female who presents for the following: Patient reports areas of concern on her L elbow and L posterior upper leg. Patient denied personal or known family hx of skin cancer.     The following portions of the chart were reviewed this encounter and updated as appropriate: medications, allergies, medical history  Review of Systems:  No other skin or systemic complaints except as noted in HPI or Assessment and Plan.  Objective  Well appearing patient in no apparent distress; mood and affect are within normal limits.   A focused examination was performed of the following areas: Arm, Leg, back, face  Relevant exam findings are noted in the Assessment and Plan.  - Multiple stuck-on brown, tan and grey papillated papules and plaques on L leg, L arm  - scaly erythematous macules consistent with actinic keratoses located on R cheek - Actinic Elastosis: chronic sun damage: dyspigmentation, telangiectasia, and wrinkling - 6-20 mm pigmented macules that are tan to brown in color and are somewhat non-uniform in shape and concentrated in the sun-exposed areas    Posterior L upper arm, Posterior L upper leg (2) Stuck on waxy paps with erythema R cheek Pink scaly macules  Assessment & Plan   Seborrheic keratosis, inflamed on L arm and L leg - Discussed diagnosis, typical course, and treatment options for this condition - Reassurance, benign, monitor - ABCDE's discussed - Given irritation and symptoms, will proceed with cryotherapy as below   BENIGN SKIN FINDINGS  - Lentigines  - Seborrheic keratoses - Reassurance provided regarding the benign appearance of lesions noted on exam today; no treatment is indicated in the absence of symptoms/changes. - Reinforced importance of photoprotective strategies including liberal and frequent sunscreen use of a broad-spectrum SPF 30 or greater, use of protective clothing, and sun avoidance  for prevention of cutaneous malignancy and photoaging.  Counseled patient on the importance of regular self-skin monitoring as well as routine clinical skin examinations as scheduled.   ACTINIC DAMAGE - Chronic condition, secondary to cumulative UV/sun exposure - Recommend daily broad spectrum sunscreen SPF 30+ to sun-exposed areas, reapply every 2 hours as needed.  - Staying in the shade or wearing long sleeves, sun glasses (UVA+UVB protection) and wide brim hats (4-inch brim around the entire circumference of the hat) are also recommended for sun protection.  - Call for new or changing lesions.   INFLAMED SEBORRHEIC KERATOSIS (2) Posterior L upper arm, Posterior L upper leg (2) Symptomatic, irritating, patient would like treated. Destruction of lesion - Posterior L upper arm, Posterior L upper leg (2)  Destruction method: cryotherapy   Informed consent: discussed and consent obtained   Lesion destroyed using liquid nitrogen: Yes   Region frozen until ice ball extended beyond lesion: Yes   Outcome: patient tolerated procedure well with no complications   Post-procedure details: wound care instructions given   Additional details:  Prior to procedure, discussed risks of blister formation, small wound, skin dyspigmentation, or rare scar following cryotherapy. Recommend Vaseline ointment to treated areas while healing.   ACTINIC KERATOSIS R cheek Actinic keratoses are precancerous spots that appear secondary to cumulative UV radiation exposure/sun exposure over time. They are chronic with expected duration over 1 year. A portion of actinic keratoses will progress to squamous cell carcinoma of the skin. It is not possible to reliably predict which spots will progress to skin cancer and so treatment is recommended to prevent development of skin  cancer.  Recommend daily broad spectrum sunscreen SPF 30+ to sun-exposed areas, reapply every 2 hours as needed.  Recommend staying in the shade or  wearing long sleeves, sun glasses (UVA+UVB protection) and wide brim hats (4-inch brim around the entire circumference of the hat). Call for new or changing lesions. Destruction of lesion - R cheek Complexity: simple   Destruction method: cryotherapy   Informed consent: discussed and consent obtained   Timeout:  patient name, date of birth, surgical site, and procedure verified Lesion destroyed using liquid nitrogen: Yes   Region frozen until ice ball extended beyond lesion: Yes   Outcome: patient tolerated procedure well with no complications   Post-procedure details: wound care instructions given   Additional details:  Prior to procedure, discussed risks of blister formation, small wound, skin dyspigmentation, or rare scar following cryotherapy. Recommend Vaseline ointment to treated areas while healing.    Return in about 1 year (around 05/01/2025) for TBSE.  I, Emerick Ege, CMA am acting as scribe for Lauraine JAYSON Kanaris, MD.    Documentation: I have reviewed the above documentation for accuracy and completeness, and I agree with the above.  Lauraine JAYSON Kanaris, MD

## 2024-05-05 ENCOUNTER — Telehealth: Payer: Self-pay | Admitting: Family Medicine

## 2024-05-05 DIAGNOSIS — R7303 Prediabetes: Secondary | ICD-10-CM

## 2024-05-05 DIAGNOSIS — E7800 Pure hypercholesterolemia, unspecified: Secondary | ICD-10-CM

## 2024-05-05 DIAGNOSIS — Z85038 Personal history of other malignant neoplasm of large intestine: Secondary | ICD-10-CM

## 2024-05-05 NOTE — Telephone Encounter (Signed)
-----   Message from Veva JINNY Ferrari sent at 04/22/2024  3:17 PM EDT ----- Regarding: Lab orders for Tue, 9.2.25 Patient is scheduled for CPX labs, please order future labs, Thanks , Veva

## 2024-05-06 ENCOUNTER — Other Ambulatory Visit (INDEPENDENT_AMBULATORY_CARE_PROVIDER_SITE_OTHER): Payer: PPO

## 2024-05-06 ENCOUNTER — Ambulatory Visit: Payer: Self-pay | Admitting: Family Medicine

## 2024-05-06 DIAGNOSIS — R7303 Prediabetes: Secondary | ICD-10-CM | POA: Diagnosis not present

## 2024-05-06 DIAGNOSIS — E7800 Pure hypercholesterolemia, unspecified: Secondary | ICD-10-CM | POA: Diagnosis not present

## 2024-05-06 LAB — LIPID PANEL
Cholesterol: 166 mg/dL (ref 0–200)
HDL: 59.9 mg/dL (ref >39.00)
LDL Cholesterol: 75 mg/dL (ref 0–99)
NonHDL: 105.97
Total CHOL/HDL Ratio: 3
Triglycerides: 153 mg/dL — ABNORMAL HIGH (ref 0.0–149.0)
VLDL: 30.6 mg/dL (ref 0.0–40.0)

## 2024-05-06 LAB — TSH: TSH: 1.9 u[IU]/mL (ref 0.35–5.50)

## 2024-05-06 LAB — COMPREHENSIVE METABOLIC PANEL WITH GFR
ALT: 11 U/L (ref 0–35)
AST: 18 U/L (ref 0–37)
Albumin: 4.5 g/dL (ref 3.5–5.2)
Alkaline Phosphatase: 44 U/L (ref 39–117)
BUN: 18 mg/dL (ref 6–23)
CO2: 28 meq/L (ref 19–32)
Calcium: 9.1 mg/dL (ref 8.4–10.5)
Chloride: 102 meq/L (ref 96–112)
Creatinine, Ser: 0.66 mg/dL (ref 0.40–1.20)
GFR: 83.79 mL/min (ref >60.00)
Glucose, Bld: 95 mg/dL (ref 70–99)
Potassium: 4.8 meq/L (ref 3.5–5.1)
Sodium: 139 meq/L (ref 135–145)
Total Bilirubin: 0.5 mg/dL (ref 0.2–1.2)
Total Protein: 6.9 g/dL (ref 6.0–8.3)

## 2024-05-06 LAB — HEMOGLOBIN A1C: Hgb A1c MFr Bld: 6.6 % — ABNORMAL HIGH (ref 4.6–6.5)

## 2024-05-07 NOTE — Telephone Encounter (Signed)
 Copied from CRM #[id number]. Topic: Clinical - Lab/Test Results >> May 06, 2024  3:46 PM Montie POUR wrote: Reason for CRM:  Lori Peters had a missed call from this clinic. Please call her back about her lab results. Her number is [phone]. Thanks

## 2024-05-07 NOTE — Telephone Encounter (Signed)
 Spoke with pt relaying Dr Graham message. Pt verbalizes understanding and expresses her thanks for the call back.

## 2024-05-12 ENCOUNTER — Encounter: Payer: Self-pay | Admitting: Family Medicine

## 2024-05-12 ENCOUNTER — Ambulatory Visit (INDEPENDENT_AMBULATORY_CARE_PROVIDER_SITE_OTHER): Payer: PPO | Admitting: Family Medicine

## 2024-05-12 VITALS — BP 118/76 | HR 69 | Temp 97.9°F | Ht 59.75 in | Wt 115.0 lb

## 2024-05-12 DIAGNOSIS — M8580 Other specified disorders of bone density and structure, unspecified site: Secondary | ICD-10-CM

## 2024-05-12 DIAGNOSIS — Z0000 Encounter for general adult medical examination without abnormal findings: Secondary | ICD-10-CM | POA: Diagnosis not present

## 2024-05-12 DIAGNOSIS — Z23 Encounter for immunization: Secondary | ICD-10-CM

## 2024-05-12 DIAGNOSIS — E7800 Pure hypercholesterolemia, unspecified: Secondary | ICD-10-CM

## 2024-05-12 DIAGNOSIS — R7303 Prediabetes: Secondary | ICD-10-CM

## 2024-05-12 MED ORDER — ROSUVASTATIN CALCIUM 10 MG PO TABS: 10.0000 mg | ORAL_TABLET | Freq: Every day | ORAL | 3 refills | Status: AC

## 2024-05-12 NOTE — Assessment & Plan Note (Addendum)
 Reviewed health habits including diet and exercise and skin cancer prevention Reviewed appropriate screening tests for age  Also reviewed health mt list, fam hx and immunization status , as well as social and family history   See HPI Labs reviewed and ordered Health Maintenance  Topic Date Due   Mammogram  10/23/2018   Medicare Annual Wellness Visit  05/09/2024   COVID-19 Vaccine (3 - Pfizer risk series) 11/13/2024*   DTaP/Tdap/Td vaccine (2 - Tdap) 01/11/2031   Pneumococcal Vaccine for age over 55  Completed   Flu Shot  Completed   DEXA scan (bone density measurement)  Completed   Hepatitis C Screening  Completed   Zoster (Shingles) Vaccine  Completed   HPV Vaccine  Aged Out   Meningitis B Vaccine  Aged Out   Colon Cancer Screening  Discontinued  *Topic was postponed. The date shown is not the original due date.    Flu shot today Sent to gyn office for last mammo/pap/dexa No falls or fractures  Discussed fall prevention, supplements and exercise for bone density  PHQ 0

## 2024-05-12 NOTE — Assessment & Plan Note (Signed)
 Disc goals for lipids and reasons to control them Rev last labs with pt Rev low sat fat diet in detail LDL at 75  Continues crestor  10 mg daily

## 2024-05-12 NOTE — Assessment & Plan Note (Signed)
 Lab Results  Component Value Date   HGBA1C 6.6 (H) 05/06/2024   HGBA1C 5.7 (A) 10/29/2023   HGBA1C 6.2 04/24/2023   disc imp of low glycemic diet and wt loss to prevent DM2  Follow up 3 mo

## 2024-05-12 NOTE — Progress Notes (Signed)
 Subjective:    Patient ID: Lori Peters, female    DOB: 10-09-1945, 78 y.o.   MRN: [id number]  HPI  Here for health maintenance exam and to review chronic medical problems   Wt Readings from Last 3 Encounters:  05/12/24 115 lb (52.2 kg)  10/29/23 115 lb 6 oz (52.3 kg)  05/10/23 115 lb 9.6 oz (52.4 kg)   22.65 kg/m  Vitals:   05/12/24 1054  BP: 118/76  Pulse: 69  Temp: 97.9 F (36.6 C)  SpO2: 98%    Immunization History  Administered Date(s) Administered   Fluad Quad(high Dose 65+) 08/09/2020   INFLUENZA, HIGH DOSE SEASONAL PF 06/11/2017, 06/28/2018, 05/28/2019, 06/27/2023, 05/12/2024   Influenza Split 08/04/2011   Influenza Whole 06/02/2008, 06/24/2009   Influenza,inj,Quad PF,6+ Mos 04/30/2013, 06/16/2014, 07/20/2015, 06/08/2016   PFIZER(Purple Top)SARS-COV-2 Vaccination 10/11/2019, 11/01/2019   Pneumococcal Conjugate-13 08/04/2016   Pneumococcal Polysaccharide-23 04/30/2013   Td 01/10/2021   Zoster Recombinant(Shingrix) 07/28/2019, 09/14/2019   Zoster, Live 02/04/2013    Health Maintenance Due  Topic Date Due   MAMMOGRAM  10/23/2018   Medicare Annual Wellness (AWV)  05/09/2024   Flu shot today   Mammogram-utd/sent to gyn for report  Self breast exam no lumps   Gyn health [facility]    Colon cancer screening  Colonoscopy 11/2020- adenomas - 3 y recall  Had consult on 9/23 to schedule this  Past history of colon cancer   Bone health  Dexa - [facility] , sent for report/has every 2 years  Osteopenia -monitored by gyn [place]- none  Fractures-none  Supplements ca and D   Exercise  Walking more lately   Saw dermatology in August    Mood    05/12/2024   11:56 AM 05/10/2023   10:17 AM 04/24/2023    9:31 AM 09/26/2021    9:40 AM 04/07/2020    8:19 AM  Depression screen PHQ 2/9  Decreased Interest 0 0 0 0 0  Down, Depressed, Hopeless 0 0 0 0 0  PHQ - 2 Score 0 0 0 0 0  Altered sleeping 0 0 0  0  Tired, decreased energy 0 0 0   0  Change in appetite 0 0 0  0  Feeling bad or failure about yourself  0 0 0  0  Trouble concentrating 0 0 0  0  Moving slowly or fidgety/restless 0 0 0  0  Suicidal thoughts 0 0 0  0  PHQ-9 Score 0 0 0  0  Difficult doing work/chores Not difficult at all Not difficult at all Not difficult at all  Not difficult at all    Prediabetes Lab Results  Component Value Date   HGBA1C 6.6 (H) 05/06/2024   HGBA1C 5.7 (A) 10/29/2023   HGBA1C 6.2 04/24/2023   No dm in family   Has not had any steroids   Diet is pretty good  Uses splenda in coffee in am  Occational diet drinks   Husband is diabetic  Usually no sweets in house   Once in a while pc of candy or cookie-not often  Has little cups of ice cream      Hyperlipidemia Lab Results  Component Value Date   CHOL 166 05/06/2024   CHOL 147 06/12/2023   CHOL 294 (H) 04/24/2023   Lab Results  Component Value Date   HDL 59.90 05/06/2024   HDL 57.30 06/12/2023   HDL 61.00 04/24/2023   Lab Results  Component Value Date   LDLCALC 75 05/06/2024   LDLCALC 61 06/12/2023   LDLCALC 150 (H) 08/04/2016   Lab Results  Component Value Date   TRIG 153.0 (H) 05/06/2024   TRIG 141.0 06/12/2023   TRIG 262.0 (H) 04/24/2023   Lab Results  Component Value Date   CHOLHDL 3 05/06/2024   CHOLHDL 3 06/12/2023   CHOLHDL 5 04/24/2023   Lab Results  Component Value Date   LDLDIRECT 162.0 04/24/2023   LDLDIRECT 144.9 04/17/2008   Crestor  10 mg daily   Lab Results  Component Value Date   NA 139 05/06/2024   K 4.8 05/06/2024   CO2 28 05/06/2024   GLUCOSE 95 05/06/2024   BUN 18 05/06/2024   CREATININE 0.66 05/06/2024   CALCIUM  9.1 05/06/2024   GFR 83.79 05/06/2024   GFRNONAA >60 01/08/2017   Lab Results  Component Value Date   ALT 11 05/06/2024   AST 18 05/06/2024   ALKPHOS 44 05/06/2024   BILITOT 0.5 05/06/2024     Patient Active Problem List   Diagnosis Date Noted   Prediabetes 09/26/2021   Tinnitus 01/01/2017    Routine general medical examination at a health care facility 08/03/2016   HYPERCHOLESTEROLEMIA 04/03/2007   Osteoarthritis 04/03/2007   Osteopenia 04/03/2007   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   DVT, HX OF 04/03/2007   Past Medical History:  Diagnosis Date   Anxiety    Arthritis    Cancer (HCC)    colon   Colorectal cancer (HCC) 10/14/2019   Depression    GERD (gastroesophageal reflux disease)    Palpitations    Past Surgical History:  Procedure Laterality Date   APPENDECTOMY     BREAST SURGERY     BIOPSY   CATARACT EXTRACTION W/PHACO Right 11/12/2018   Procedure: CATARACT EXTRACTION PHACO AND INTRAOCULAR LENS PLACEMENT (IOC), RIGHT;  Surgeon: Jaye Fallow, MD;  Location: [facility];  Service: Ophthalmology;  Laterality: Right;  US  00:59.2 CDE 11.21 Fluid pack Lot # [id number]   CATARACT EXTRACTION W/PHACO Left 01/21/2019   Procedure: CATARACT EXTRACTION PHACO AND INTRAOCULAR LENS PLACEMENT (IOC) LEFT;  Surgeon: Jaye Fallow, MD;  Location: [facility];  Service: Ophthalmology;  Laterality: Left;   CHOLECYSTECTOMY     COLON SURGERY     OSTOMY TAKE DOWN     Social History   Tobacco Use   Smoking status: Never   Smokeless tobacco: Never  Substance Use Topics   Alcohol use: No    Alcohol/week: 0.0 standard drinks of alcohol   Drug use: No   History reviewed. No pertinent family history. Allergies  Allergen Reactions   Fosamax [Alendronate Sodium] Other (See Comments)    GI side eff     Garlic Rash   Current Outpatient Medications on File Prior to Visit  Medication Sig Dispense Refill   Calcium  Carb-Cholecalciferol (RA CALCIUM  PLUS VITAMIN D PO) Take by mouth.     nystatin cream (MYCOSTATIN) Apply 1 Application topically 2 (two) times daily.     triamcinolone cream (KENALOG) 0.1 % Apply 1 Application topically 2 (two) times daily.     No current facility-administered medications on file prior to visit.    Review of Systems   Constitutional:  Negative for activity change, appetite change, fatigue, fever and unexpected weight change.  HENT:  Negative for congestion, ear pain, rhinorrhea, sinus pressure and sore throat.   Eyes:  Negative for pain, redness and visual disturbance.  Respiratory:  Negative for cough, shortness of  breath and wheezing.   Cardiovascular:  Negative for chest pain and palpitations.  Gastrointestinal:  Negative for abdominal pain, blood in stool, constipation and diarrhea.  Endocrine: Negative for polydipsia and polyuria.  Genitourinary:  Negative for dysuria, frequency and urgency.  Musculoskeletal:  Negative for arthralgias, back pain and myalgias.  Skin:  Negative for pallor and rash.  Allergic/Immunologic: Negative for environmental allergies.  Neurological:  Negative for dizziness, syncope and headaches.  Hematological:  Negative for adenopathy. Does not bruise/bleed easily.  Psychiatric/Behavioral:  Negative for decreased concentration and dysphoric mood. The patient is not nervous/anxious.        Objective:   Physical Exam Constitutional:      General: She is not in acute distress.    Appearance: Normal appearance. She is well-developed and normal weight. She is not ill-appearing or diaphoretic.  HENT:     Head: Normocephalic and atraumatic.     Right Ear: Tympanic membrane, ear canal and external ear normal.     Left Ear: Tympanic membrane, ear canal and external ear normal.     Nose: Nose normal. No congestion.     Mouth/Throat:     Mouth: Mucous membranes are moist.     Pharynx: Oropharynx is clear. No posterior oropharyngeal erythema.  Eyes:     General: No scleral icterus.    Extraocular Movements: Extraocular movements intact.     Conjunctiva/sclera: Conjunctivae normal.     Pupils: Pupils are equal, round, and reactive to light.  Neck:     Thyroid : No thyromegaly.     Vascular: No carotid bruit or JVD.  Cardiovascular:     Rate and Rhythm: Normal rate and  regular rhythm.     Pulses: Normal pulses.     Heart sounds: Normal heart sounds.     No gallop.  Pulmonary:     Effort: Pulmonary effort is normal. No respiratory distress.     Breath sounds: Normal breath sounds. No wheezing.     Comments: Good air exch Chest:     Chest wall: No tenderness.  Abdominal:     General: Bowel sounds are normal. There is no distension or abdominal bruit.     Palpations: Abdomen is soft. There is no mass.     Tenderness: There is no abdominal tenderness.     Hernia: No hernia is present.  Genitourinary:    Comments: Breast exam: No mass, nodules, thickening, tenderness, bulging, retraction, inflamation, nipple discharge or skin changes noted.  No axillary or clavicular LA.     Musculoskeletal:        General: No tenderness. Normal range of motion.     Cervical back: Normal range of motion and neck supple. No rigidity. No muscular tenderness.     Right lower leg: No edema.     Left lower leg: No edema.     Comments: No kyphosis   Lymphadenopathy:     Cervical: No cervical adenopathy.  Skin:    General: Skin is warm and dry.     Coloration: Skin is not pale.     Findings: No erythema or rash.     Comments: Solar lentigines diffusely   Neurological:     Mental Status: She is alert. Mental status is at baseline.     Cranial Nerves: No cranial nerve deficit.     Motor: No abnormal muscle tone.     Coordination: Coordination normal.     Gait: Gait normal.     Deep Tendon Reflexes: Reflexes are normal and  symmetric. Reflexes normal.  Psychiatric:        Mood and Affect: Mood normal.        Cognition and Memory: Cognition and memory normal.           Assessment & Plan:   Problem List Items Addressed This Visit       Musculoskeletal and Integument   Osteopenia   Utd dexa from gyn Sent for report  No falls or fractures   Discussed fall prevention, supplements and exercise for bone density          Other   Routine general medical  examination at a health care facility - Primary   Reviewed health habits including diet and exercise and skin cancer prevention Reviewed appropriate screening tests for age  Also reviewed health mt list, fam hx and immunization status , as well as social and family history   See HPI Labs reviewed and ordered Health Maintenance  Topic Date Due   Mammogram  10/23/2018   Medicare Annual Wellness Visit  05/09/2024   COVID-19 Vaccine (3 - Pfizer risk series) 11/13/2024*   DTaP/Tdap/Td vaccine (2 - Tdap) 01/11/2031   Pneumococcal Vaccine for age over 60  Completed   Flu Shot  Completed   DEXA scan (bone density measurement)  Completed   Hepatitis C Screening  Completed   Zoster (Shingles) Vaccine  Completed   HPV Vaccine  Aged Out   Meningitis B Vaccine  Aged Out   Colon Cancer Screening  Discontinued  *Topic was postponed. The date shown is not the original due date.    Flu shot today Sent to gyn office for last mammo/pap/dexa No falls or fractures  .[email or web address]       Prediabetes   Lab Results  Component Value Date   HGBA1C 6.6 (H) 05/06/2024   HGBA1C 5.7 (A) 10/29/2023   HGBA1C 6.2 04/24/2023   disc imp of low glycemic diet and wt loss to prevent DM2  Follow up 3 mo      HYPERCHOLESTEROLEMIA   Disc goals for lipids and reasons to control them Rev last labs with pt Rev low sat fat diet in detail LDL at 75  Continues crestor  10 mg daily      Relevant Medications   rosuvastatin  (CRESTOR ) 10 MG tablet   Other Visit Diagnoses       Need for influenza vaccination       Relevant Orders   Flu vaccine HIGH DOSE PF(Fluzone Trivalent) (Completed)

## 2024-05-12 NOTE — Patient Instructions (Addendum)
 Keep walking  Add some strength training to your routine, this is important for bone and brain health and can reduce your risk of falls and help your body use insulin properly and regulate weight  Light weights, exercise bands , and internet videos are a good way to start  Yoga (chair or regular), machines , floor exercises or a gym with machines are also good options    Your glucose is in the mild diabetes range  Avoid added sugars and sweets   Try to get most of your carbohydrates from produce (with the exception of white potatoes) and whole grains Eat less bread/pasta/rice/snack foods/cereals/sweets and other items from the middle of the grocery store (processed carbs)  Follow up in 3 months to re check and discuss blood sugar

## 2024-05-12 NOTE — Assessment & Plan Note (Signed)
 Utd dexa from gyn Sent for report  No falls or fractures   Discussed fall prevention, supplements and exercise for bone density

## 2024-05-16 ENCOUNTER — Encounter: Payer: Self-pay | Admitting: Obstetrics and Gynecology

## 2024-05-28 DIAGNOSIS — K5900 Constipation, unspecified: Secondary | ICD-10-CM | POA: Diagnosis not present

## 2024-05-28 DIAGNOSIS — Z85038 Personal history of other malignant neoplasm of large intestine: Secondary | ICD-10-CM | POA: Diagnosis not present

## 2024-05-28 DIAGNOSIS — Z86018 Personal history of other benign neoplasm: Secondary | ICD-10-CM | POA: Diagnosis not present

## 2024-06-17 DIAGNOSIS — Z08 Encounter for follow-up examination after completed treatment for malignant neoplasm: Secondary | ICD-10-CM | POA: Diagnosis not present

## 2024-06-17 DIAGNOSIS — K648 Other hemorrhoids: Secondary | ICD-10-CM | POA: Diagnosis not present

## 2024-06-17 DIAGNOSIS — Z85038 Personal history of other malignant neoplasm of large intestine: Secondary | ICD-10-CM | POA: Diagnosis not present

## 2024-06-17 DIAGNOSIS — Z980 Intestinal bypass and anastomosis status: Secondary | ICD-10-CM | POA: Diagnosis not present

## 2024-06-17 DIAGNOSIS — D124 Benign neoplasm of descending colon: Secondary | ICD-10-CM | POA: Diagnosis not present

## 2024-06-17 DIAGNOSIS — D123 Benign neoplasm of transverse colon: Secondary | ICD-10-CM | POA: Diagnosis not present

## 2024-06-19 DIAGNOSIS — D123 Benign neoplasm of transverse colon: Secondary | ICD-10-CM | POA: Diagnosis not present

## 2024-06-19 DIAGNOSIS — D124 Benign neoplasm of descending colon: Secondary | ICD-10-CM | POA: Diagnosis not present

## 2024-08-11 ENCOUNTER — Encounter: Payer: Self-pay | Admitting: Family Medicine

## 2024-08-11 ENCOUNTER — Ambulatory Visit (INDEPENDENT_AMBULATORY_CARE_PROVIDER_SITE_OTHER): Admitting: Family Medicine

## 2024-08-11 VITALS — BP 110/64 | HR 69 | Temp 97.7°F | Ht 59.75 in | Wt 106.4 lb

## 2024-08-11 DIAGNOSIS — R7303 Prediabetes: Secondary | ICD-10-CM

## 2024-08-11 LAB — POCT GLYCOSYLATED HEMOGLOBIN (HGB A1C): Hemoglobin A1C: 6 % — AB (ref 4.0–5.6)

## 2024-08-11 NOTE — Patient Instructions (Addendum)
 Stay active  Add some strength training to your routine, this is important for bone and brain health and can reduce your risk of falls and help your body use insulin properly and regulate weight  Light weights, exercise bands , and internet videos are a good way to start  Yoga (chair or regular), machines , floor exercises or a gym with machines are also good options   Eat a lot of lean protein The following are examples of protein in diet  Meat (lean)  Fish  Eggs  Dairy products  Soy products  Oat milk  Almond milk Nuts and nut butters  Legumes  Dried beans    Be mindful of sweets/carbs Avoid added sugars in your diet when you can  Try to get most of your carbohydrates from produce (with the exception of white potatoes) and whole grains Eat less bread/pasta/rice/snack foods/cereals/sweets and other items from the middle of the grocery store (processed carbs)  Follow up in 6 months for visit with A1c test again

## 2024-08-11 NOTE — Progress Notes (Signed)
 Subjective:    Patient ID: Lori Peters, female    DOB: Oct 28, 1945, 78 y.o.   MRN: [id number]  HPI  Wt Readings from Last 3 Encounters:  08/11/24 106 lb 6 oz (48.3 kg)  05/12/24 115 lb (52.2 kg)  10/29/23 115 lb 6 oz (52.3 kg)   20.95 kg/m  Vitals:   08/11/24 0847  BP: 110/64  Pulse: 69  Temp: 97.7 F (36.5 C)  SpO2: 99%    Pt presents for follow up of chronic medical problems including  Prediabetes  Husb had knee surgery  Has been hard on her    Feels ok  No excessive thirst or urination  Has been prediabetic for a while  Just recently A1c went up to 6.6     Prediabetes Down to 6.0 today -good  Lab Results  Component Value Date   HGBA1C 6.0 (A) 08/11/2024   HGBA1C 6.6 (H) 05/06/2024   HGBA1C 5.7 (A) 10/29/2023   Last visit A1c went up   Discussed goals for low glycemic diet and exercise   Weight came down from 115 to 106  Has cut out all of her sweets  Feels ok   Husband is diabetic    No one in the family with diabetes   Exercise-not a lot right now  Likes to get out and walk with daughters or use treadmill  Has some small weights   Occational diet drink Uses splenda in coffee now     Hyperlipidemia Lab Results  Component Value Date   CHOL 166 05/06/2024   HDL 59.90 05/06/2024   LDLCALC 75 05/06/2024   LDLDIRECT 162.0 04/24/2023   TRIG 153.0 (H) 05/06/2024   CHOLHDL 3 05/06/2024   Controlled with crestor  10 mg daily    Patient Active Problem List   Diagnosis Date Noted   Prediabetes 09/26/2021   Tinnitus 01/01/2017   Routine general medical examination at a health care facility 08/03/2016   HYPERCHOLESTEROLEMIA 04/03/2007   Osteoarthritis 04/03/2007   Osteopenia 04/03/2007   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   DVT, HX OF 04/03/2007   Past Medical History:  Diagnosis Date   Anxiety    Arthritis    Cancer (HCC)    colon   Colorectal cancer (HCC) 10/14/2019   Depression    GERD  (gastroesophageal reflux disease)    Palpitations    Past Surgical History:  Procedure Laterality Date   APPENDECTOMY     BREAST SURGERY     BIOPSY   CATARACT EXTRACTION W/PHACO Right 11/12/2018   Procedure: CATARACT EXTRACTION PHACO AND INTRAOCULAR LENS PLACEMENT (IOC), RIGHT;  Surgeon: Jaye Fallow, MD;  Location: [facility];  Service: Ophthalmology;  Laterality: Right;  US  00:59.2 CDE 11.21 Fluid pack Lot # [id number]   CATARACT EXTRACTION W/PHACO Left 01/21/2019   Procedure: CATARACT EXTRACTION PHACO AND INTRAOCULAR LENS PLACEMENT (IOC) LEFT;  Surgeon: Jaye Fallow, MD;  Location: [facility];  Service: Ophthalmology;  Laterality: Left;   CHOLECYSTECTOMY     COLON SURGERY     OSTOMY TAKE DOWN     Social History   Tobacco Use   Smoking status: Never   Smokeless tobacco: Never  Substance Use Topics   Alcohol use: No    Alcohol/week: 0.0 standard drinks of alcohol   Drug use: No   History reviewed. No pertinent family history. Allergies  Allergen Reactions   Fosamax [Alendronate Sodium] Other (See Comments)    GI side eff     Garlic Rash  Current Outpatient Medications on File Prior to Visit  Medication Sig Dispense Refill   Calcium  Carb-Cholecalciferol (RA CALCIUM  PLUS VITAMIN D PO) Take by mouth.     nystatin cream (MYCOSTATIN) Apply 1 Application topically 2 (two) times daily.     rosuvastatin  (CRESTOR ) 10 MG tablet Take 1 tablet (10 mg total) by mouth daily. 90 tablet 3   triamcinolone cream (KENALOG) 0.1 % Apply 1 Application topically 2 (two) times daily.     No current facility-administered medications on file prior to visit.    Review of Systems  Constitutional:  Negative for activity change, appetite change, fatigue, fever and unexpected weight change.  HENT:  Negative for congestion, ear pain, rhinorrhea, sinus pressure and sore throat.   Eyes:  Negative for pain, redness and visual disturbance.  Respiratory:  Negative for cough, shortness of  breath and wheezing.   Cardiovascular:  Negative for chest pain and palpitations.  Gastrointestinal:  Negative for abdominal pain, blood in stool, constipation and diarrhea.  Endocrine: Negative for polydipsia and polyuria.  Genitourinary:  Negative for dysuria, frequency and urgency.  Musculoskeletal:  Negative for arthralgias, back pain and myalgias.  Skin:  Negative for pallor and rash.  Allergic/Immunologic: Negative for environmental allergies.  Neurological:  Negative for dizziness, syncope and headaches.  Hematological:  Negative for adenopathy. Does not bruise/bleed easily.  Psychiatric/Behavioral:  Negative for decreased concentration and dysphoric mood. The patient is not nervous/anxious.        Objective:   Physical Exam Constitutional:      General: She is not in acute distress.    Appearance: Normal appearance. She is normal weight. She is not ill-appearing or diaphoretic.     Comments: slim  Cardiovascular:     Rate and Rhythm: Normal rate and regular rhythm.  Pulmonary:     Effort: Pulmonary effort is normal. No respiratory distress.  Musculoskeletal:     Cervical back: Neck supple.  Neurological:     Mental Status: She is alert.  Psychiatric:        Mood and Affect: Mood normal.           Assessment & Plan:   Problem List Items Addressed This Visit       Other   Prediabetes - Primary   Lab Results  Component Value Date   HGBA1C 6.0 (A) 08/11/2024   HGBA1C 6.6 (H) 05/06/2024   HGBA1C 5.7 (A) 10/29/2023   Improved with lower sugar diet /stopped sweets and added sugar Do not want more weight loss Encouraged more protein (lean) and muscle building exercise Plan to use weights   Handouts given Follow up in 6 months for visit with A1c       Relevant Orders   POCT HgB A1C (Completed)

## 2024-08-11 NOTE — Assessment & Plan Note (Signed)
 Lab Results  Component Value Date   HGBA1C 6.0 (A) 08/11/2024   HGBA1C 6.6 (H) 05/06/2024   HGBA1C 5.7 (A) 10/29/2023   Improved with lower sugar diet /stopped sweets and added sugar Do not want more weight loss Encouraged more protein (lean) and muscle building exercise Plan to use weights   Handouts given Follow up in 6 months for visit with A1c

## 2025-02-09 ENCOUNTER — Ambulatory Visit: Admitting: Family Medicine

## 2025-05-07 ENCOUNTER — Ambulatory Visit
